# Patient Record
Sex: Female | Born: 1945 | Race: White | Hispanic: No | Marital: Single | Smoking: Current some day smoker
Health system: Southern US, Community
[De-identification: ages and names within clinical notes are randomized; demographics above are authoritative.]

## PROBLEM LIST (undated history)

## (undated) DIAGNOSIS — G629 Polyneuropathy, unspecified: Secondary | ICD-10-CM

## (undated) DIAGNOSIS — I4891 Unspecified atrial fibrillation: Secondary | ICD-10-CM

## (undated) DIAGNOSIS — H269 Unspecified cataract: Secondary | ICD-10-CM

## (undated) DIAGNOSIS — E079 Disorder of thyroid, unspecified: Secondary | ICD-10-CM

## (undated) DIAGNOSIS — E119 Type 2 diabetes mellitus without complications: Secondary | ICD-10-CM

## (undated) DIAGNOSIS — H3530 Unspecified macular degeneration: Secondary | ICD-10-CM

## (undated) HISTORY — PX: HERNIA REPAIR: SHX51

## (undated) HISTORY — PX: SPLENECTOMY: SUR1306

## (undated) HISTORY — PX: OTHER SURGICAL HISTORY: SHX169

## (undated) HISTORY — PX: ABDOMINAL HYSTERECTOMY: SHX81

## (undated) HISTORY — PX: CHOLECYSTECTOMY: SHX55

## (undated) HISTORY — PX: APPENDECTOMY: SHX54

---

## 2015-07-29 ENCOUNTER — Emergency Department
Admission: EM | Admit: 2015-07-29 | Discharge: 2015-07-29 | Disposition: A | Discharge status: Home / Self Care | Payer: Medicare PPO | Attending: Emergency Medicine | Admitting: Emergency Medicine

## 2015-07-29 ENCOUNTER — Encounter: Payer: Self-pay | Admitting: *Deleted

## 2015-07-29 DIAGNOSIS — F419 Anxiety disorder, unspecified: Secondary | ICD-10-CM | POA: Insufficient documentation

## 2015-07-29 DIAGNOSIS — Z760 Encounter for issue of repeat prescription: Secondary | ICD-10-CM | POA: Diagnosis present

## 2015-07-29 DIAGNOSIS — E119 Type 2 diabetes mellitus without complications: Secondary | ICD-10-CM | POA: Diagnosis not present

## 2015-07-29 DIAGNOSIS — Z720 Tobacco use: Secondary | ICD-10-CM | POA: Insufficient documentation

## 2015-07-29 DIAGNOSIS — G8929 Other chronic pain: Secondary | ICD-10-CM | POA: Diagnosis not present

## 2015-07-29 DIAGNOSIS — R109 Unspecified abdominal pain: Secondary | ICD-10-CM | POA: Insufficient documentation

## 2015-07-29 HISTORY — DX: Unspecified cataract: H26.9

## 2015-07-29 HISTORY — DX: Disorder of thyroid, unspecified: E07.9

## 2015-07-29 HISTORY — DX: Polyneuropathy, unspecified: G62.9

## 2015-07-29 HISTORY — DX: Unspecified macular degeneration: H35.30

## 2015-07-29 HISTORY — DX: Unspecified atrial fibrillation: I48.91

## 2015-07-29 HISTORY — DX: Type 2 diabetes mellitus without complications: E11.9

## 2015-07-29 MED ORDER — TRAZODONE HCL 100 MG PO TABS: 100.0000 mg | ORAL_TABLET | Freq: Every day | ORAL | Status: AC

## 2015-07-29 MED ORDER — OXYCODONE-ACETAMINOPHEN 5-325 MG PO TABS
1.0000 | ORAL_TABLET | Freq: Once | ORAL | Status: AC
  Administered 2015-07-29: 1 via ORAL
  Filled 2015-07-29: qty 1

## 2015-07-29 MED ORDER — OXYCODONE-ACETAMINOPHEN 7.5-325 MG PO TABS: 1.0000 | ORAL_TABLET | Freq: Three times a day (TID) | ORAL | Status: DC | PRN

## 2015-07-29 MED ORDER — LORAZEPAM 0.5 MG PO TABS
0.5000 mg | ORAL_TABLET | Freq: Once | ORAL | Status: AC
  Administered 2015-07-29: 0.5 mg via ORAL

## 2015-07-29 MED ORDER — LORAZEPAM 0.5 MG PO TABS: 0.5000 mg | ORAL_TABLET | Freq: Four times a day (QID) | ORAL | Status: AC | PRN

## 2015-07-29 NOTE — ED Notes (Signed)
Patient is here with med tech from [facility]. Med tech Autumn AndreasGladys [name] states patient is out of several medications until she can call MD on Monday. Medications include oxycodone 7.5mg /325mg  and Lorazepam 0.5mg . Patient was recently transferred from another facility. Patient ran out of meds after yesterday doses.

## 2015-07-29 NOTE — ED Notes (Signed)
Discussed discharge instructions, prescriptions, and follow-up care with patient and care giver. No questions or concerns at this time. Pt stable at discharge. 

## 2015-07-29 NOTE — ED Provider Notes (Signed)
[facility] Emergency Department Provider Note ____________________________________________  Time seen: Approximately 2:37 PM  I have reviewed the triage vital signs and the nursing notes.   HISTORY  Chief Complaint Medication Refill   HPI Autumn [name] is a 69 y.o. female who presents to the emergency department for medication refill. She was recently moved from one living facility to another and is out of some of her medications. Her last dose of lorazepam and percocet was last night. She states she has been on these medications since having a splenectomy and had multiple rib fractures on the left side. Caregiver at bedside will have her see MD on Monday, but requests medications until that appointment.No new complaint.  Past Medical History  Diagnosis Date  . Macular degeneration   . Neuropathy (HCC)   . Diabetes mellitus without complication (HCC)   . Cataracts, bilateral   . Thyroid disease   . Atrial fibrillation (HCC)     There are no active problems to display for this patient.   Past Surgical History  Procedure Laterality Date  . Abdominal hysterectomy      partial  . Appendectomy    . Cholecystectomy    . Splenectomy    . Liver repair      liver laceration  . Hernia repair      hiatal    Current Outpatient Rx  Name  Route  Sig  Dispense  Refill  . LORazepam (ATIVAN) 0.5 MG tablet   Oral   Take 1 tablet (0.5 mg total) by mouth every 6 (six) hours as needed for anxiety.   8 tablet   0   . oxyCODONE-acetaminophen (PERCOCET) 7.5-325 MG tablet   Oral   Take 1 tablet by mouth every 8 (eight) hours as needed for severe pain.   8 tablet   0   . traZODone (DESYREL) 100 MG tablet   Oral   Take 1 tablet (100 mg total) by mouth at bedtime.   2 tablet   0     Allergies Ceclor; Iodine; and Stelazine  No family history on file.  Social History Social History  Substance Use Topics  . Smoking status: Current Some Day Smoker   . Smokeless tobacco: None  . Alcohol Use: No    Review of Systems Constitutional: No fever/chills Eyes: No visual changes. ENT: No sore throat. Cardiovascular: Denies chest pain. Respiratory: Denies shortness of breath. Gastrointestinal: chronic abdominal/thoracic pain.  No nausea, no vomiting.  No diarrhea.  No constipation. Genitourinary: Negative for dysuria. Musculoskeletal: Negative for back pain. Skin: Negative for rash. Neurological: Negative for headaches, focal weakness or numbness.  10-point ROS otherwise negative.  ____________________________________________   PHYSICAL EXAM:  VITAL SIGNS: ED Triage Vitals  Enc Vitals Group     BP 07/29/15 1332 132/78 mmHg     Pulse Rate 07/29/15 1332 89     Resp 07/29/15 1332 20     Temp 07/29/15 1332 98.9 F (37.2 C)     Temp Source 07/29/15 1332 Oral     SpO2 07/29/15 1332 93 %     Weight 07/29/15 1332 195 lb (88.451 kg)     Height 07/29/15 1332 5\' 6"  (1.676 m)     Head Cir --      Peak Flow --      Pain Score 07/29/15 1337 9     Pain Loc --      Pain Edu? --      Excl. in GC? --  Constitutional: Alert and oriented. Well appearing and in no acute distress. Eyes: Conjunctivae are normal. PERRL. EOMI. Head: Atraumatic. Nose: No congestion/rhinnorhea. Mouth/Throat: Mucous membranes are moist.  Oropharynx non-erythematous. Neck: No stridor.   Cardiovascular: Normal rate, regular rhythm. Grossly normal heart sounds.  Good peripheral circulation. Respiratory: Normal respiratory effort.  No retractions. Lungs CTAB. Gastrointestinal: Soft and nontender. No distention. No abdominal bruits. No CVA tenderness. Surgical scar noted on abdomen. Musculoskeletal: No lower extremity tenderness nor edema.  No joint effusions. Neurologic:  Normal speech and language. No gross focal neurologic deficits are appreciated. No gait instability. Skin:  Skin is warm, dry and intact. No rash noted. Psychiatric: Mood and affect are  normal. Very anxious.  ____________________________________________   LABS (all labs ordered are listed, but only abnormal results are displayed)  Labs Reviewed - No data to display ____________________________________________  EKG   ____________________________________________  RADIOLOGY  Not indicted. ____________________________________________   PROCEDURES  Procedure(s) performed: None  Critical Care performed: No  ____________________________________________   INITIAL IMPRESSION / ASSESSMENT AND PLAN / ED COURSE  Pertinent labs & imaging results that were available during my care of the patient were reviewed by me and considered in my medical decision making (see chart for details).  Patient was given doses of lorazepam and percocet to prevent withdrawal. Caregiver was advised to have her see the MD assigned to the facility on Monday. She was advised to return to the ER for symptoms that change or worsen or for new concerns if unable to see the MD. ____________________________________________   FINAL CLINICAL IMPRESSION(S) / ED DIAGNOSES  Final diagnoses:  Encounter for medication refill      Chinita Pester, FNP 07/29/15 1442  Jennye Moccasin, MD 07/29/15 1536

## 2015-07-29 NOTE — Discharge Instructions (Signed)
Medicine Refill at the Emergency Department  We have refilled your medicine today, but it is best for you to get refills through your primary health care provider's office. In the future, please plan ahead so you do not need to get refills from the emergency department.  If the medicine we refilled was a maintenance medicine, you may have received only enough to get you by until you are able to see your regular health care provider.     This information is not intended to replace advice given to you by your health care provider. Make sure you discuss any questions you have with your health care provider.     Document Released: 12/27/2003 Document Revised: 09/30/2014 Document Reviewed: 12/17/2013  Elsevier Interactive Patient Education 2016 [employer]

## 2015-12-27 ENCOUNTER — Ambulatory Visit: Payer: Medicare PPO | Admitting: Anesthesiology

## 2016-03-29 ENCOUNTER — Other Ambulatory Visit: Payer: Self-pay | Admitting: Family Medicine

## 2016-03-29 DIAGNOSIS — Z720 Tobacco use: Secondary | ICD-10-CM

## 2016-09-25 ENCOUNTER — Ambulatory Visit
Admission: RE | Admit: 2016-09-25 | Discharge: 2016-09-25 | Disposition: A | Discharge status: Home / Self Care | Payer: Medicare PPO | Source: Ambulatory Visit | Attending: Primary Care | Admitting: Primary Care

## 2016-09-25 ENCOUNTER — Other Ambulatory Visit: Payer: Self-pay | Admitting: Primary Care

## 2016-09-25 DIAGNOSIS — R918 Other nonspecific abnormal finding of lung field: Secondary | ICD-10-CM | POA: Insufficient documentation

## 2016-09-25 DIAGNOSIS — R0602 Shortness of breath: Secondary | ICD-10-CM

## 2016-10-09 ENCOUNTER — Encounter: Payer: Self-pay | Admitting: Emergency Medicine

## 2016-10-09 ENCOUNTER — Emergency Department: Payer: Medicare Other

## 2016-10-09 ENCOUNTER — Inpatient Hospital Stay
Admission: EM | Admit: 2016-10-09 | Discharge: 2016-10-12 | DRG: 603 | Disposition: A | Discharge status: Home / Self Care | Payer: Medicare Other | Attending: Internal Medicine | Admitting: Internal Medicine

## 2016-10-09 DIAGNOSIS — Z91041 Radiographic dye allergy status: Secondary | ICD-10-CM | POA: Diagnosis not present

## 2016-10-09 DIAGNOSIS — L03116 Cellulitis of left lower limb: Secondary | ICD-10-CM

## 2016-10-09 DIAGNOSIS — Z8249 Family history of ischemic heart disease and other diseases of the circulatory system: Secondary | ICD-10-CM | POA: Diagnosis not present

## 2016-10-09 DIAGNOSIS — Z881 Allergy status to other antibiotic agents status: Secondary | ICD-10-CM

## 2016-10-09 DIAGNOSIS — Z79899 Other long term (current) drug therapy: Secondary | ICD-10-CM

## 2016-10-09 DIAGNOSIS — Z888 Allergy status to other drugs, medicaments and biological substances status: Secondary | ICD-10-CM | POA: Diagnosis not present

## 2016-10-09 DIAGNOSIS — K219 Gastro-esophageal reflux disease without esophagitis: Secondary | ICD-10-CM | POA: Diagnosis present

## 2016-10-09 DIAGNOSIS — E039 Hypothyroidism, unspecified: Secondary | ICD-10-CM | POA: Diagnosis not present

## 2016-10-09 DIAGNOSIS — M7989 Other specified soft tissue disorders: Secondary | ICD-10-CM | POA: Diagnosis present

## 2016-10-09 DIAGNOSIS — H3530 Unspecified macular degeneration: Secondary | ICD-10-CM | POA: Diagnosis not present

## 2016-10-09 DIAGNOSIS — I10 Essential (primary) hypertension: Secondary | ICD-10-CM | POA: Diagnosis not present

## 2016-10-09 DIAGNOSIS — F17210 Nicotine dependence, cigarettes, uncomplicated: Secondary | ICD-10-CM | POA: Diagnosis present

## 2016-10-09 DIAGNOSIS — I4891 Unspecified atrial fibrillation: Secondary | ICD-10-CM | POA: Diagnosis present

## 2016-10-09 DIAGNOSIS — Z9081 Acquired absence of spleen: Secondary | ICD-10-CM | POA: Diagnosis not present

## 2016-10-09 DIAGNOSIS — Z7901 Long term (current) use of anticoagulants: Secondary | ICD-10-CM | POA: Diagnosis not present

## 2016-10-09 DIAGNOSIS — F329 Major depressive disorder, single episode, unspecified: Secondary | ICD-10-CM | POA: Diagnosis present

## 2016-10-09 DIAGNOSIS — H269 Unspecified cataract: Secondary | ICD-10-CM | POA: Diagnosis not present

## 2016-10-09 DIAGNOSIS — Z90710 Acquired absence of both cervix and uterus: Secondary | ICD-10-CM

## 2016-10-09 DIAGNOSIS — Z79891 Long term (current) use of opiate analgesic: Secondary | ICD-10-CM

## 2016-10-09 DIAGNOSIS — G8929 Other chronic pain: Secondary | ICD-10-CM | POA: Diagnosis not present

## 2016-10-09 DIAGNOSIS — E1140 Type 2 diabetes mellitus with diabetic neuropathy, unspecified: Secondary | ICD-10-CM | POA: Diagnosis present

## 2016-10-09 DIAGNOSIS — L0390 Cellulitis, unspecified: Secondary | ICD-10-CM | POA: Diagnosis present

## 2016-10-09 LAB — CBC WITH DIFFERENTIAL/PLATELET
BASOS PCT: 1 %
Basophils Absolute: 0.2 10*3/uL — ABNORMAL HIGH (ref 0–0.1)
EOS ABS: 0.3 10*3/uL (ref 0–0.7)
Eosinophils Relative: 2 %
HCT: 40.7 % (ref 35.0–47.0)
Hemoglobin: 13.6 g/dL (ref 12.0–16.0)
LYMPHS ABS: 1.6 10*3/uL (ref 1.0–3.6)
Lymphocytes Relative: 12 %
MCH: 32.6 pg (ref 26.0–34.0)
MCHC: 33.3 g/dL (ref 32.0–36.0)
MCV: 97.8 fL (ref 80.0–100.0)
Monocytes Absolute: 1.3 10*3/uL — ABNORMAL HIGH (ref 0.2–0.9)
Monocytes Relative: 10 %
Neutro Abs: 9.9 10*3/uL — ABNORMAL HIGH (ref 1.4–6.5)
Neutrophils Relative %: 75 %
PLATELETS: 195 10*3/uL (ref 150–440)
RBC: 4.16 MIL/uL (ref 3.80–5.20)
RDW: 14 % (ref 11.5–14.5)
WBC: 13.2 10*3/uL — AB (ref 3.6–11.0)

## 2016-10-09 LAB — BASIC METABOLIC PANEL
Anion gap: 6 (ref 5–15)
BUN: 11 mg/dL (ref 6–20)
CALCIUM: 9 mg/dL (ref 8.9–10.3)
CHLORIDE: 104 mmol/L (ref 101–111)
CO2: 30 mmol/L (ref 22–32)
CREATININE: 1.06 mg/dL — AB (ref 0.44–1.00)
GFR calc Af Amer: >60 mL/min (ref >60)
GFR calc non Af Amer: 52 mL/min — ABNORMAL LOW (ref >60)
Glucose, Bld: 147 mg/dL — ABNORMAL HIGH (ref 65–99)
Potassium: 4.1 mmol/L (ref 3.5–5.1)
SODIUM: 140 mmol/L (ref 135–145)

## 2016-10-09 LAB — MRSA PCR SCREENING: MRSA BY PCR: POSITIVE — AB

## 2016-10-09 LAB — PROTIME-INR
INR: 3.08
PROTHROMBIN TIME: 32.5 s — AB (ref 11.4–15.2)

## 2016-10-09 MED ORDER — OXYCODONE HCL ER 10 MG PO T12A
10.0000 mg | EXTENDED_RELEASE_TABLET | Freq: Four times a day (QID) | ORAL | Status: DC
Start: 2016-10-09 — End: 2016-10-12
  Administered 2016-10-09 – 2016-10-12 (×12): 10 mg via ORAL
  Filled 2016-10-09 (×12): qty 1

## 2016-10-09 MED ORDER — GABAPENTIN 300 MG PO CAPS
300.0000 mg | ORAL_CAPSULE | Freq: Every day | ORAL | Status: DC
  Administered 2016-10-09 – 2016-10-11 (×3): 300 mg via ORAL
  Filled 2016-10-09 (×3): qty 1

## 2016-10-09 MED ORDER — CHOLECALCIFEROL 10 MCG (400 UNIT) PO TABS
400.0000 [IU] | ORAL_TABLET | Freq: Every day | ORAL | Status: DC
  Administered 2016-10-10 – 2016-10-12 (×3): 400 [IU] via ORAL
  Filled 2016-10-09 (×3): qty 1

## 2016-10-09 MED ORDER — ACETAMINOPHEN 325 MG PO TABS: 650.0000 mg | ORAL_TABLET | Freq: Four times a day (QID) | ORAL | Status: DC | PRN

## 2016-10-09 MED ORDER — LEVOTHYROXINE SODIUM 125 MCG PO TABS
125.0000 ug | ORAL_TABLET | Freq: Every day | ORAL | Status: DC
  Administered 2016-10-10 – 2016-10-12 (×3): 125 ug via ORAL
  Filled 2016-10-09 (×3): qty 1

## 2016-10-09 MED ORDER — FENTANYL 12 MCG/HR TD PT72
12.5000 ug | MEDICATED_PATCH | TRANSDERMAL | Status: DC
  Administered 2016-10-11: 12.5 ug via TRANSDERMAL
  Filled 2016-10-09: qty 1

## 2016-10-09 MED ORDER — VANCOMYCIN HCL IN DEXTROSE 1-5 GM/200ML-% IV SOLN
1000.0000 mg | INTRAVENOUS | Status: DC
  Administered 2016-10-10 – 2016-10-12 (×4): 1000 mg via INTRAVENOUS
  Filled 2016-10-09 (×5): qty 200

## 2016-10-09 MED ORDER — MIRTAZAPINE 15 MG PO TBDP
30.0000 mg | ORAL_TABLET | Freq: Every day | ORAL | Status: DC
  Administered 2016-10-09 – 2016-10-11 (×3): 30 mg via ORAL
  Filled 2016-10-09 (×3): qty 2

## 2016-10-09 MED ORDER — ONDANSETRON HCL 4 MG/2ML IJ SOLN: 4.0000 mg | Freq: Four times a day (QID) | INTRAMUSCULAR | Status: DC | PRN

## 2016-10-09 MED ORDER — ENOXAPARIN SODIUM 40 MG/0.4ML ~~LOC~~ SOLN: 40.0000 mg | SUBCUTANEOUS | Status: DC

## 2016-10-09 MED ORDER — FAMOTIDINE 20 MG PO TABS
20.0000 mg | ORAL_TABLET | Freq: Every day | ORAL | Status: DC
Start: 2016-10-10 — End: 2016-10-12
  Administered 2016-10-10 – 2016-10-12 (×3): 20 mg via ORAL
  Filled 2016-10-09 (×3): qty 1

## 2016-10-09 MED ORDER — CLINDAMYCIN HCL 150 MG PO CAPS
300.0000 mg | ORAL_CAPSULE | Freq: Once | ORAL | Status: AC
  Administered 2016-10-09: 300 mg via ORAL
  Filled 2016-10-09: qty 2

## 2016-10-09 MED ORDER — DULOXETINE HCL 60 MG PO CPEP
60.0000 mg | ORAL_CAPSULE | Freq: Every day | ORAL | Status: DC
  Administered 2016-10-10 – 2016-10-12 (×3): 60 mg via ORAL
  Filled 2016-10-09 (×3): qty 1

## 2016-10-09 MED ORDER — VANCOMYCIN HCL IN DEXTROSE 1-5 GM/200ML-% IV SOLN
1000.0000 mg | Freq: Once | INTRAVENOUS | Status: AC
  Administered 2016-10-09: 1000 mg via INTRAVENOUS
  Filled 2016-10-09: qty 200

## 2016-10-09 MED ORDER — TRAZODONE HCL 100 MG PO TABS
100.0000 mg | ORAL_TABLET | Freq: Every day | ORAL | Status: DC
  Administered 2016-10-09 – 2016-10-11 (×3): 100 mg via ORAL
  Filled 2016-10-09 (×3): qty 1

## 2016-10-09 MED ORDER — METOPROLOL TARTRATE 50 MG PO TABS
100.0000 mg | ORAL_TABLET | Freq: Two times a day (BID) | ORAL | Status: DC
  Administered 2016-10-09 – 2016-10-12 (×6): 100 mg via ORAL
  Filled 2016-10-09 (×6): qty 2

## 2016-10-09 MED ORDER — ONDANSETRON HCL 4 MG PO TABS: 4.0000 mg | ORAL_TABLET | Freq: Four times a day (QID) | ORAL | Status: DC | PRN

## 2016-10-09 MED ORDER — ACETAMINOPHEN 650 MG RE SUPP: 650.0000 mg | Freq: Four times a day (QID) | RECTAL | Status: DC | PRN

## 2016-10-09 MED ORDER — WARFARIN - PHYSICIAN DOSING INPATIENT: Freq: Every day | Status: DC

## 2016-10-09 MED ORDER — WARFARIN SODIUM 3 MG PO TABS
6.0000 mg | ORAL_TABLET | Freq: Every day | ORAL | Status: DC
  Administered 2016-10-09: 6 mg via ORAL
  Filled 2016-10-09: qty 2

## 2016-10-09 MED ORDER — AMLODIPINE BESYLATE 10 MG PO TABS
10.0000 mg | ORAL_TABLET | Freq: Every day | ORAL | Status: DC
  Administered 2016-10-10 – 2016-10-12 (×2): 10 mg via ORAL
  Filled 2016-10-09 (×2): qty 1

## 2016-10-09 NOTE — ED Notes (Signed)
Marchelle Folksmanda Sales promotion account executive(Director of group home): [phone] [facility] (Group Home): [phone]

## 2016-10-09 NOTE — H&P (Signed)
[employer] [facility] [place] [facility]   PATIENT NAME: Autumn [name]    MR#:  [id number]  DATE OF BIRTH:  [birth year]  DATE OF ADMISSION:  10/09/2016  PRIMARY CARE PHYSICIAN: Ruel Favors, MD   REQUESTING/REFERRING PHYSICIAN: Dr. Governor Rooks  CHIEF COMPLAINT:   Chief Complaint  Patient presents with  . Wound Infection    HISTORY OF PRESENT ILLNESS:  Autumn [name]  is a 71 y.o. female with a known history of atrial fibrillation, diabetes, macular degeneration, neuropathy, hypothyroidism who presents to the hospital due to right lower extremity redness swelling. Patient resides at a assisted living and the nursing staff noticed that her left leg was red and swollen and therefore sent her to the ER for further evaluation. Patient was clinically noted to have a right lower extremity cellulitis and therefore hospitalist services were contacted further treatment and evaluation. Patient denies any fever, chills, sea, vomiting, abdominal pain or any other associated symptoms presently.  PAST MEDICAL HISTORY:   Past Medical History:  Diagnosis Date  . Atrial fibrillation (HCC)   . Cataracts, bilateral   . Diabetes mellitus without complication (HCC)   . Macular degeneration   . Neuropathy (HCC)   . Thyroid disease     PAST SURGICAL HISTORY:   Past Surgical History:  Procedure Laterality Date  . ABDOMINAL HYSTERECTOMY     partial  . APPENDECTOMY    . CHOLECYSTECTOMY    . HERNIA REPAIR     hiatal  . liver repair     liver laceration  . SPLENECTOMY      SOCIAL HISTORY:   Social History  Substance Use Topics  . Smoking status: Current Some Day Smoker    Packs/day: 0.50    Years: 50.00    Types: Cigarettes  . Smokeless tobacco: Never Used  . Alcohol use No    FAMILY HISTORY:   Family History  Problem Relation Age of Onset  . Heart failure Mother   . Throat cancer Father     DRUG ALLERGIES:   Allergies  Allergen Reactions  . Ceclor [Cefaclor]  Hives  . Iodine Hives    IV contrast dye  . Stelazine [Trifluoperazine] Hives    REVIEW OF SYSTEMS:   Review of Systems  Constitutional: Negative for chills, fever and weight loss.  HENT: Negative for congestion, nosebleeds and tinnitus.   Eyes: Negative for blurred vision, double vision and redness.  Respiratory: Negative for cough, hemoptysis, shortness of breath and wheezing.   Cardiovascular: Positive for leg swelling (right Lower ext. ). Negative for chest pain, orthopnea and PND.  Gastrointestinal: Negative for abdominal pain, diarrhea, melena, nausea and vomiting.  Genitourinary: Negative for dysuria, hematuria and urgency.  Musculoskeletal: Negative for falls and joint pain.  Neurological: Negative for dizziness, tingling, sensory change, focal weakness, seizures, weakness and headaches.  Endo/Heme/Allergies: Negative for polydipsia. Does not bruise/bleed easily.  Psychiatric/Behavioral: Negative for depression and memory loss. The patient is not nervous/anxious.   All other systems reviewed and are negative.   MEDICATIONS AT HOME:   Prior to Admission medications   Medication Sig Start Date End Date Taking? Authorizing Provider  amLODipine (NORVASC) 10 MG tablet Take 10 mg by mouth daily.   Yes Historical Provider, MD  Cholecalciferol (VITAMIN D3) 400 units CHEW Chew 1 tablet by mouth daily.   Yes Historical Provider, MD  DULoxetine (CYMBALTA) 60 MG capsule Take 60 mg by mouth daily.   Yes Historical Provider, MD  fentaNYL (DURAGESIC - DOSED MCG/HR)  12 MCG/HR Place 1 patch onto the skin every 3 (three) days. 11/02/15  Yes Historical Provider, MD  gabapentin (NEURONTIN) 300 MG capsule Take 300 mg by mouth at bedtime.   Yes Historical Provider, MD  levothyroxine (SYNTHROID, LEVOTHROID) 125 MCG tablet Take 125 mcg by mouth daily.    Yes Historical Provider, MD  metoprolol (LOPRESSOR) 100 MG tablet Take 1 tablet by mouth 2 (two) times daily.   Yes Historical Provider, MD   mirtazapine (REMERON SOL-TAB) 30 MG disintegrating tablet Take 30 mg by mouth at bedtime.   Yes Historical Provider, MD  oxyCODONE (OXYCONTIN) 10 mg 12 hr tablet Take 10 mg by mouth 4 (four) times daily.   Yes Historical Provider, MD  ranitidine (ZANTAC) 150 MG tablet Take 150 mg by mouth 2 (two) times daily as needed.   Yes Historical Provider, MD  traZODone (DESYREL) 100 MG tablet Take 1 tablet (100 mg total) by mouth at bedtime. 07/29/15  Yes Cari B Triplett, FNP  warfarin (COUMADIN) 6 MG tablet Take 6 mg by mouth at bedtime. 09/24/16 09/24/17 Yes Historical Provider, MD  oxyCODONE-acetaminophen (PERCOCET) 7.5-325 MG tablet Take 1 tablet by mouth every 8 (eight) hours as needed for severe pain. Patient not taking: Reported on 10/09/2016 07/29/15   Chinita Pester, FNP      VITAL SIGNS:  Blood pressure (!) 122/55, pulse 89, temperature 99.2 F (37.3 C), temperature source Oral, resp. rate 14, SpO2 94 %.  PHYSICAL EXAMINATION:  Physical Exam  GENERAL:  71 y.o.-year-old patient lying in the bed in no acute distress.  EYES: Pupils equal, round, reactive to light and accommodation. No scleral icterus. Extraocular muscles intact.  HEENT: Head atraumatic, normocephalic. Oropharynx and nasopharynx clear. No oropharyngeal erythema, moist oral mucosa  NECK:  Supple, no jugular venous distention. No thyroid enlargement, no tenderness.  LUNGS: Normal breath sounds bilaterally, no wheezing, rales, rhonchi. No use of accessory muscles of respiration.  CARDIOVASCULAR: S1, S2 RRR. No murmurs, rubs, gallops, clicks.  ABDOMEN: Soft, nontender, nondistended. Bowel sounds present. No organomegaly or mass.  EXTREMITIES: RLE edema > Left, No cyanosis, or clubbing. + 2 pedal & radial pulses b/l.   NEUROLOGIC: Cranial nerves II through XII are intact. No focal Motor or sensory deficits appreciated b/l. PSYCHIATRIC: The patient is alert and oriented x 3. SKIN: No obvious rash, lesion, or ulcer.  LLE consistent with  cellulitis.   LABORATORY PANEL:   CBC  Recent Labs Lab 10/09/16 1040  WBC 13.2*  HGB 13.6  HCT 40.7  PLT 195   ------------------------------------------------------------------------------------------------------------------  Chemistries   Recent Labs Lab 10/09/16 1040  NA 140  K 4.1  CL 104  CO2 30  GLUCOSE 147*  BUN 11  CREATININE 1.06*  CALCIUM 9.0   ------------------------------------------------------------------------------------------------------------------  Cardiac Enzymes No results for input(s): TROPONINI in the last 168 hours. ------------------------------------------------------------------------------------------------------------------  RADIOLOGY:  US Venous Img Lower Unilateral Left  Result Date: 10/09/2016 CLINICAL DATA:  Left leg swelling for 10 days EXAM: LEFT LOWER EXTREMITY VENOUS DUPLEX ULTRASOUND TECHNIQUE: Doppler venous assessment of the left lower extremity deep venous system was performed, including characterization of spectral flow, compressibility, and phasicity. COMPARISON:  None. FINDINGS: There is complete compressibility of the common femoral, femoral, and popliteal veins. Doppler analysis demonstrates respiratory phasicity and augmentation of flow with calf compression. No obvious superficial vein or calf vein thrombosis. 0.8 cm short axis diameter left inguinal lymph node is noted. This is not pathologically enlarged by measurement criteria. IMPRESSION: No evidence of left lower extremity  DVT. Electronically Signed   By: Jolaine ClickArthur  Hoss M.D.   On: 10/09/2016 12:02     IMPRESSION AND PLAN:   71 year old female with past medical history of atrial fibrillation, macular degeneration, diabetes, hypothyroidism, neuropathy who presented to the hospital with Left lower extremity redness and swelling.  1. Left lower extremity cellulitis-this is a cause of patient's redness and swelling. -Dopplers of the lower extremities are (-) for DVT.  -  place on IV VAncomycin and follow cultures.   2. Leukocytosis - due to # 1 - will treat w/ IV abx and follow WBC count.   3. Chronic Pain - cont. Oxycodone, Fentanyl patch.   4. HTN - cont. Metoprolol, Norvasc  5. Atrial fibrillation-rate controlled. Continue metoprolol. -Continue Coumadin. INR therapeutic.  6. Hypothyroidism-continue Synthroid.  7. Neuropathy-continue gabapentin.  8. Depression-continue Cymbalta.  9. GERD-continue Zantac.  All the records are reviewed and case discussed with ED provider. Management plans discussed with the patient, family and they are in agreement.  CODE STATUS: Full  TOTAL TIME TAKING CARE OF THIS PATIENT: 45 minutes.    Houston SirenSAINANI,VIVEK J M.D on 10/09/2016 at 2:35 PM  Between 7am to 6pm - Pager - [phone]  After 6pm go to [email or web address] - password [email or web address] [facility]  [facility]  Office  [phone]  CC: Primary care physician; Ruel FavorsKrichna F Sowles, MD

## 2016-10-09 NOTE — ED Triage Notes (Addendum)
Pt to ED via EMS from group home, c/o left leg infection, red and warm to touch. Per EMS VS stable. Pt states she has noticed swelling x1wk. Obvious redness and swelling noted to area.

## 2016-10-09 NOTE — ED Notes (Signed)
Dr. Lord in room to assess patient.  Will continue to monitor.   

## 2016-10-09 NOTE — Progress Notes (Signed)
Pharmacy Antibiotic Note  Autumn [name] is a 71 y.o. female admitted on 10/09/2016 with  cellulitis.  Pharmacy has been consulted for Vancomycin dosing.  Plan: Ke: 0.051   T1/2:  14  Vd: 62  Will start the patient on Vancomycin 1gm IV every 18 hours after 6 hours stack dosing. Calculated trough at Css is 13. Will plan for trough prior to 4th dose. Will continue to monitor renal function and adjust dose as needed.   Height: 5\' 6"  (167.6 cm) Weight: 195 lb (88.5 kg) IBW/kg (Calculated) : 59.3  Temp (24hrs), Avg:99.2 F (37.3 C), Min:99.2 F (37.3 C), Max:99.2 F (37.3 C)   Recent Labs Lab 10/09/16 1040  WBC 13.2*  CREATININE 1.06*    Estimated Creatinine Clearance: 56.1 mL/min (by C-G formula based on SCr of 1.06 mg/dL (H)).    Allergies  Allergen Reactions  . Ceclor [Cefaclor] Hives  . Iodine Hives    IV contrast dye  . Stelazine [Trifluoperazine] Hives    Antimicrobials this admission: 1/17 vancomycin >>   Dose adjustments this admission:   Microbiology results:  Thank you for allowing pharmacy to be a part of this patient's care.  Gardner CandleSheema M Chellie Vanlue, PharmD, BCPS Clinical Pharmacist 10/09/2016 5:10 PM

## 2016-10-09 NOTE — ED Provider Notes (Signed)
[facility] Emergency Department Provider Note ____________________________________________   I have reviewed the triage vital signs and the triage nursing note.  HISTORY  Chief Complaint Wound Infection   Historian Patient  HPI Autumn [name] is a 71 y.o. female from a group home, history of afib, dm, and le edema, states she just got over uri/bronchitis here for eval of lle swelling and redness.  States was normal about 1 week ago, slowly getting worse.  Mild pain, moderate to severe swelling and redness.  No associated fever.  The uri symptoms are all improved/essentially resolved    Past Medical History:  Diagnosis Date  . Atrial fibrillation (HCC)   . Cataracts, bilateral   . Diabetes mellitus without complication (HCC)   . Macular degeneration   . Neuropathy (HCC)   . Thyroid disease     There are no active problems to display for this patient.   Past Surgical History:  Procedure Laterality Date  . ABDOMINAL HYSTERECTOMY     partial  . APPENDECTOMY    . CHOLECYSTECTOMY    . HERNIA REPAIR     hiatal  . liver repair     liver laceration  . SPLENECTOMY      Prior to Admission medications   Medication Sig Start Date End Date Taking? Authorizing Provider  amLODipine (NORVASC) 10 MG tablet Take 10 mg by mouth daily.   Yes Historical Provider, MD  Cholecalciferol (VITAMIN D3) 400 units CHEW Chew 1 tablet by mouth daily.   Yes Historical Provider, MD  DULoxetine (CYMBALTA) 60 MG capsule Take 60 mg by mouth daily.   Yes Historical Provider, MD  fentaNYL (DURAGESIC - DOSED MCG/HR) 12 MCG/HR Place 1 patch onto the skin every 3 (three) days. 11/02/15  Yes Historical Provider, MD  gabapentin (NEURONTIN) 300 MG capsule Take 300 mg by mouth at bedtime.   Yes Historical Provider, MD  levothyroxine (SYNTHROID, LEVOTHROID) 137 MCG tablet Take 137 mcg by mouth daily.   Yes Historical Provider, MD  METOPROLOL TARTRATE PO Take 1 tablet by mouth 2 (two)  times daily.   Yes Historical Provider, MD  oxyCODONE (OXYCONTIN) 10 mg 12 hr tablet Take 10 mg by mouth 4 (four) times daily.   Yes Historical Provider, MD  ranitidine (ZANTAC) 150 MG tablet Take 150 mg by mouth 2 (two) times daily as needed.   Yes Historical Provider, MD  traZODone (DESYREL) 100 MG tablet Take 1 tablet (100 mg total) by mouth at bedtime. 07/29/15  Yes Cari B Triplett, FNP  warfarin (COUMADIN) 6 MG tablet Take 6 mg by mouth at bedtime. 09/24/16 09/24/17 Yes Historical Provider, MD  oxyCODONE-acetaminophen (PERCOCET) 7.5-325 MG tablet Take 1 tablet by mouth every 8 (eight) hours as needed for severe pain. Patient not taking: Reported on 10/09/2016 07/29/15   Chinita Pesterari B Triplett, FNP    Allergies  Allergen Reactions  . Ceclor [Cefaclor] Hives  . Iodine Hives    IV contrast dye  . Stelazine [Trifluoperazine] Hives    No family history on file.  Social History Social History  Substance Use Topics  . Smoking status: Current Some Day Smoker  . Smokeless tobacco: Never Used  . Alcohol use No    Review of Systems  Constitutional: Negative for fever. Eyes: Negative for visual changes. ENT: Negative for sore throat. Cardiovascular: Negative for chest pain. Respiratory: Negative for shortness of breath. Gastrointestinal: Negative for abdominal pain, vomiting and diarrhea. Genitourinary: Negative for dysuria. Musculoskeletal: Negative for back pain.  Positive for  left leg pain Skin: Negative for rash. Neurological: Negative for headache. 10 point Review of Systems otherwise negative ____________________________________________   PHYSICAL EXAM:  VITAL SIGNS: ED Triage Vitals [10/09/16 1034]  Enc Vitals Group     BP 130/76     Pulse Rate 95     Resp 16     Temp 99.2 F (37.3 C)     Temp Source Oral     SpO2 94 %     Weight      Height      Head Circumference      Peak Flow      Pain Score 0     Pain Loc      Pain Edu?      Excl. in GC?      Constitutional:  Alert and oriented. Well appearing and in no distress. HEENT   Head: Normocephalic and atraumatic.      Eyes: Conjunctivae are normal. PERRL. Normal extraocular movements.      Ears:         Nose: No congestion/rhinnorhea.   Mouth/Throat: Mucous membranes are moist.   Neck: No stridor. Cardiovascular/Chest: Normal rate, regular rhythm.  No murmurs, rubs, or gallops. Respiratory: Normal respiratory effort without tachypnea nor retractions. Breath sounds are clear and equal bilaterally. No wheezes/rales/rhonchi. Gastrointestinal: Soft. No distention, no guarding, no rebound. Nontender.    Genitourinary/rectal:Deferred Musculoskeletal: Patient in the left calf where is severe edema. Neurologic:  Normal speech and language. No gross or focal neurologic deficits are appreciated. Skin: Cellulitis appearance to lle Psychiatric: Mood and affect are normal. Speech and behavior are normal. Patient exhibits appropriate insight and judgment.    ____________________________________________  LABS (pertinent positives/negatives)  Labs Reviewed  BASIC METABOLIC PANEL - Abnormal; Notable for the following:       Result Value   Glucose, Bld 147 (*)    Creatinine, Ser 1.06 (*)    GFR calc non Af Amer 52 (*)    All other components within normal limits  CBC WITH DIFFERENTIAL/PLATELET - Abnormal; Notable for the following:    WBC 13.2 (*)    Neutro Abs 9.9 (*)    Monocytes Absolute 1.3 (*)    Basophils Absolute 0.2 (*)    All other components within normal limits  PROTIME-INR - Abnormal; Notable for the following:    Prothrombin Time 32.5 (*)    All other components within normal limits    ____________________________________________    EKG I, Governor Rooks, MD, the attending physician have personally viewed and interpreted all ECGs.  88 bpm. Atrial fibrillation. Normal axis. Normal ST and T-wave ____________________________________________  RADIOLOGY All Xrays were viewed by  me. Imaging interpreted by Radiologist.  Left lower extremity ultrasound:  IMPRESSION: No evidence of left lower extremity DVT. __________________________________________  PROCEDURES  Procedure(s) performed: None  Critical Care performed: None  ____________________________________________   ED COURSE / ASSESSMENT AND PLAN  Pertinent labs & imaging results that were available during my care of the patient were reviewed by me and considered in my medical decision making (see chart for details).    Ms. Greulich is here with very significantly swollen and red left lower extremity, and was ruled out for DVT, but consistent with cellulitis. Given the extent of the cellulitis, I think this is potentially limb threatening, and needs hospital observation/management.  She does have an elevated white blood cell count, however she is not febrile and does not have systemic symptoms at this point in time. She was treated with  a dose of clindamycin.    CONSULTATIONS:  Hospitalist for admission.   Patient / Family / Caregiver informed of clinical course, medical decision-making process, and agree with plan.    ___________________________________________   FINAL CLINICAL IMPRESSION(S) / ED DIAGNOSES   Final diagnoses:  Cellulitis of left lower extremity              Note: This dictation was prepared with Dragon dictation. Any transcriptional errors that result from this process are unintentional    Governor Rooks, MD 10/09/16 1311

## 2016-10-09 NOTE — ED Notes (Signed)
Patient gave verbal permission to discuss test results with Marchelle Folksmanda from her group home.

## 2016-10-10 DIAGNOSIS — L03116 Cellulitis of left lower limb: Secondary | ICD-10-CM | POA: Diagnosis not present

## 2016-10-10 DIAGNOSIS — M7989 Other specified soft tissue disorders: Secondary | ICD-10-CM | POA: Diagnosis not present

## 2016-10-10 LAB — BASIC METABOLIC PANEL
Anion gap: 6 (ref 5–15)
BUN: 10 mg/dL (ref 6–20)
CALCIUM: 8.3 mg/dL — AB (ref 8.9–10.3)
CO2: 29 mmol/L (ref 22–32)
CREATININE: 0.99 mg/dL (ref 0.44–1.00)
Chloride: 106 mmol/L (ref 101–111)
GFR calc non Af Amer: 57 mL/min — ABNORMAL LOW (ref >60)
Glucose, Bld: 123 mg/dL — ABNORMAL HIGH (ref 65–99)
Potassium: 3.7 mmol/L (ref 3.5–5.1)
SODIUM: 141 mmol/L (ref 135–145)

## 2016-10-10 LAB — CBC
HCT: 39.2 % (ref 35.0–47.0)
Hemoglobin: 13.2 g/dL (ref 12.0–16.0)
MCH: 32.9 pg (ref 26.0–34.0)
MCHC: 33.7 g/dL (ref 32.0–36.0)
MCV: 97.8 fL (ref 80.0–100.0)
PLATELETS: 167 10*3/uL (ref 150–440)
RBC: 4.01 MIL/uL (ref 3.80–5.20)
RDW: 14.4 % (ref 11.5–14.5)
WBC: 13.7 10*3/uL — AB (ref 3.6–11.0)

## 2016-10-10 LAB — PROTIME-INR
INR: 3.36
PROTHROMBIN TIME: 34.8 s — AB (ref 11.4–15.2)

## 2016-10-10 NOTE — Progress Notes (Signed)
[facility] [place] [facility]   PATIENT NAME: Autumn [name]    MR#:  [id number]  DATE OF BIRTH:  01-Dec-1945  SUBJECTIVE:   Cellulitis of lower extremity is improving  REVIEW OF SYSTEMS:    Review of Systems  Constitutional: Negative.  Negative for chills, fever and malaise/fatigue.  HENT: Negative.  Negative for ear discharge, ear pain, hearing loss, nosebleeds and sore throat.   Eyes: Negative.  Negative for blurred vision and pain.  Respiratory: Negative.  Negative for cough, hemoptysis, shortness of breath and wheezing.   Cardiovascular: Negative.  Negative for chest pain, palpitations and leg swelling.  Gastrointestinal: Negative.  Negative for abdominal pain, blood in stool, diarrhea, nausea and vomiting.  Genitourinary: Negative.  Negative for dysuria.  Musculoskeletal: Negative.  Negative for back pain.  Skin: Negative.        Cellulitis left lower extremity  Neurological: Negative for dizziness, tremors, speech change, focal weakness, seizures and headaches.  Endo/Heme/Allergies: Negative.  Does not bruise/bleed easily.  Psychiatric/Behavioral: Negative.  Negative for depression, hallucinations and suicidal ideas.    Tolerating Diet: yes      DRUG ALLERGIES:   Allergies  Allergen Reactions  . Ceclor [Cefaclor] Hives  . Iodine Hives    IV contrast dye  . Stelazine [Trifluoperazine] Hives    VITALS:  Blood pressure (!) 114/55, pulse 88, temperature 98.4 F (36.9 C), temperature source Oral, resp. rate 18, height 5\' 6"  (1.676 m), weight 88.5 kg (195 lb), SpO2 90 %.  PHYSICAL EXAMINATION:   Physical Exam  Constitutional: She is oriented to person, place, and time and well-developed, well-nourished, and in no distress. No distress.  HENT:  Head: Normocephalic.  Eyes: No scleral icterus.  Neck: Normal range of motion. Neck supple. No JVD present. No tracheal deviation present.  Cardiovascular: Normal rate, regular rhythm and normal  heart sounds.  Exam reveals no gallop and no friction rub.   No murmur heard. Pulmonary/Chest: Effort normal and breath sounds normal. No respiratory distress. She has no wheezes. She has no rales. She exhibits no tenderness.  Abdominal: Soft. Bowel sounds are normal. She exhibits no distension and no mass. There is no tenderness. There is no rebound and no guarding.  Musculoskeletal: Normal range of motion. She exhibits no edema.  Neurological: She is alert and oriented to person, place, and time.  Skin: Skin is warm. No rash noted. No erythema.  Marked erythema and swelling left lower extremity with some mild pain to palpation  Psychiatric: Affect and judgment normal.      LABORATORY PANEL:   CBC  Recent Labs Lab 10/10/16 0350  WBC 13.7*  HGB 13.2  HCT 39.2  PLT 167   ------------------------------------------------------------------------------------------------------------------  Chemistries   Recent Labs Lab 10/10/16 0350  NA 141  K 3.7  CL 106  CO2 29  GLUCOSE 123*  BUN 10  CREATININE 0.99  CALCIUM 8.3*   ------------------------------------------------------------------------------------------------------------------  Cardiac Enzymes No results for input(s): TROPONINI in the last 168 hours. ------------------------------------------------------------------------------------------------------------------  RADIOLOGY:  [place] Venous Img Lower Unilateral Left  Result Date: 10/09/2016 CLINICAL DATA:  Left leg swelling for 10 days EXAM: LEFT LOWER EXTREMITY VENOUS DUPLEX ULTRASOUND TECHNIQUE: Doppler venous assessment of the left lower extremity deep venous system was performed, including characterization of spectral flow, compressibility, and phasicity. COMPARISON:  None. FINDINGS: There is complete compressibility of the common femoral, femoral, and popliteal veins. Doppler analysis demonstrates respiratory phasicity and augmentation of flow with calf compression. No  obvious superficial vein or  calf vein thrombosis. 0.8 cm short axis diameter left inguinal lymph node is noted. This is not pathologically enlarged by measurement criteria. IMPRESSION: No evidence of left lower extremity DVT. Electronically Signed   By: Jolaine Click M.D.   On: 10/09/2016 12:02     ASSESSMENT AND PLAN:   71 year old female with a history of atrial fibrillation, diabetes and hypothyroidism who presented with left lower extremity pain and swelling  1. Left lower extremity cellulitis: Lower external he Dopplers are negative for DVT MRSA PCR was positive Continue vancomycin for at least another 24 hours Elevate legs Follow CBC   2 Chronic Pain - cont. Oxycodone, Fentanyl patch.   3. HTN - cont. Metoprolol, Norvasc Blood pressure acceptable.  4. Atrial fibrillation-rate controlled. Continue metoprolol. Pharmacy consultation for Coumadin dosing.   5. Hypothyroidism-continue Synthroid.  6 Neuropathy-continue gabapentin.  7. Depression-continue Cymbalta.  8. GERD-continue Zantac.    Management plans discussed with the patient and she is in agreement.  CODE STATUS: full  TOTAL TIME TAKING CARE OF THIS PATIENT: 30 minutes.     POSSIBLE D/C tomorrow, DEPENDING ON CLINICAL CONDITION.   Autumn [name] M.D on 10/10/2016 at 6:21 AM  Between 7am to 6pm - Pager - [phone] After 6pm go to [email or web address] - password [email or web address]  [facility] [place] [facility]  Office  [phone]  CC: Primary care physician; Ruel Favors, MD  Note: This dictation was prepared with Dragon dictation along with smaller phrase technology. Any transcriptional errors that result from this process are unintentional.

## 2016-10-10 NOTE — Clinical Social Work Note (Signed)
Clinical Social Work Assessment  Patient Details  Name: Autumn [name] MRN: [id number] Date of Birth: 06-Jul-1946  Date of referral:  10/10/16               Reason for consult:  Other (Comment Required) (From Group Home. )                Permission sought to share information with:  Facility Art therapist granted to share information::  Yes, Verbal Permission Granted  Name::        Agency::     Relationship::     Contact Information:     Housing/Transportation Living arrangements for the past 2 months:  Group Home Source of Information:  Patient Patient Interpreter Needed:  None Criminal Activity/Legal Involvement Pertinent to Current Situation/Hospitalization:  No - Comment as needed Significant Relationships:  Adult Children Lives with:  Facility Resident Do you feel safe going back to the place where you live?  Yes Need for family participation in patient care:  No (Coment)  Care giving concerns:  Patient is a resident at [facility] group home in [place], [place].   Social Worker assessment / plan:  Holiday representative (CSW) received consult that patient is from a group home. Per chart patient is from [facility] in [place]. CSW contacted group and spoke to staff member Estill Bamberg. Per Estill Bamberg patient is a resident and can return when stable. Per Estill Bamberg they can pick patient up tomorrow at 10 am if she is ready for D/C. Per Estill Bamberg if patient is not ready by 10 am the hospital will have to find a ride for patient. CSW met with patient alone at bedside to discuss D/C plan. Patient was alert and oriented X4. Patient reported that she has lived at the group home for 14 months and would like to return there. Patient reported that she is independent with all her ADL's including ambulation. Patient reported that she has 1 adult son that lives in [place], [place] that she does not talk to very often. CSW will continue to follow and assist as  needed.   Employment status:  Retired Nurse, adult PT Recommendations:  Not assessed at this time Information / Referral to community resources:  Other (Comment Required) (Patient will return to group home. )  Patient/Family's Response to care:  Patient prefers to return to group home.   Patient/Family's Understanding of and Emotional Response to Diagnosis, Current Treatment, and Prognosis:  Patient was very pleasant and thanked CSW for visit.   Emotional Assessment Appearance:  Appears stated age Attitude/Demeanor/Rapport:    Affect (typically observed):  Accepting, Adaptable, Pleasant Orientation:  Oriented to Self, Oriented to Place, Oriented to  Time, Oriented to Situation Alcohol / Substance use:  Not Applicable Psych involvement (Current and /or in the community):  No (Comment)  Discharge Needs  Concerns to be addressed:  Discharge Planning Concerns Readmission within the last 30 days:  No Current discharge risk:  None Barriers to Discharge:  Continued Medical Work up   [employer], Veronia Beets, LCSW 10/10/2016, 5:05 PM

## 2016-10-10 NOTE — NC FL2 (Signed)
[place] MEDICAID FL2 LEVEL OF CARE SCREENING TOOL     IDENTIFICATION  Patient Name: Autumn [name] Birthdate: [birth year] Sex: female Admission Date (Current Location): 10/09/2016  [facility] and [place] Number:  Chiropodist and Address:  [facility], [address] [place], [place] [address]      Provider Number: [id number]  Attending Physician Name and Address:  Houston Siren, MD  Relative Name and Phone Number:       Current Level of Care: Hospital Recommended Level of Care: [facility] Prior Approval Number:    Date Approved/Denied:   PASRR Number:  [id number])  Discharge Plan: Other (Comment) [facility]. )    Current Diagnoses: Patient Active Problem List   Diagnosis Date Noted  . Cellulitis 10/09/2016    Orientation RESPIRATION BLADDER Height & Weight     Self, Time, Situation, Place  Normal Continent Weight: 195 lb (88.5 kg) Height:  5\' 6"  (167.6 cm)  BEHAVIORAL SYMPTOMS/MOOD NEUROLOGICAL BOWEL NUTRITION STATUS   (none)  (none) Continent Diet (Diet: Heart Healthy )  AMBULATORY STATUS COMMUNICATION OF NEEDS Skin   Independent Verbally Normal                       Personal Care Assistance Level of Assistance  Bathing, Feeding, Dressing Bathing Assistance: Independent Feeding assistance: Independent Dressing Assistance: Independent     Functional Limitations Info  Sight, Hearing, Speech Sight Info: Adequate Hearing Info: Adequate Speech Info: Adequate    SPECIAL CARE FACTORS FREQUENCY  PT (By licensed PT)     PT Frequency:  (2-3 days per home health )              Contractures      Additional Factors Info  Code Status, Allergies, Isolation Precautions Code Status Info:  (Full Code. ) Allergies Info:  (Ceclor Cefaclor, Iodine, Stelazine Trifluoperazine)     Isolation Precautions Info:  (MRSA Nasal Swab. )     Current Medications (10/10/2016):  This is the current hospital  active medication list Current Facility-Administered Medications  Medication Dose Route Frequency Provider Last Rate Last Dose  . acetaminophen (TYLENOL) tablet 650 mg  650 mg Oral Q6H PRN Houston Siren, MD       Or  . acetaminophen (TYLENOL) suppository 650 mg  650 mg Rectal Q6H PRN Houston Siren, MD      . amLODipine (NORVASC) tablet 10 mg  10 mg Oral Daily Houston Siren, MD   10 mg at 10/10/16 1202  . cholecalciferol (VITAMIN D) tablet 400 Units  400 Units Oral Daily Houston Siren, MD   400 Units at 10/10/16 1201  . DULoxetine (CYMBALTA) DR capsule 60 mg  60 mg Oral Daily Houston Siren, MD   60 mg at 10/10/16 1201  . famotidine (PEPCID) tablet 20 mg  20 mg Oral Daily Houston Siren, MD   20 mg at 10/10/16 1201  . [START ON 10/11/2016] fentaNYL (DURAGESIC - dosed mcg/hr) 12.5 mcg  12.5 mcg Transdermal Q72H Houston Siren, MD      . gabapentin (NEURONTIN) capsule 300 mg  300 mg Oral QHS Houston Siren, MD   300 mg at 10/09/16 2112  . levothyroxine (SYNTHROID, LEVOTHROID) tablet 125 mcg  125 mcg Oral QAC breakfast Houston Siren, MD   125 mcg at 10/10/16 1202  . metoprolol (LOPRESSOR) tablet 100 mg  100 mg Oral BID Houston Siren, MD   100  mg at 10/10/16 1202  . mirtazapine (REMERON [name]-TAB) disintegrating tablet 30 mg  30 mg Oral QHS Houston SirenVivek J Sainani, MD   30 mg at 10/09/16 2112  . ondansetron (ZOFRAN) tablet 4 mg  4 mg Oral Q6H PRN Houston SirenVivek J Sainani, MD       Or  . ondansetron (ZOFRAN) injection 4 mg  4 mg Intravenous Q6H PRN Houston SirenVivek J Sainani, MD      . oxyCODONE (OXYCONTIN) 12 hr tablet 10 mg  10 mg Oral QID Houston SirenVivek J Sainani, MD   10 mg at 10/10/16 1642  . traZODone (DESYREL) tablet 100 mg  100 mg Oral QHS Houston SirenVivek J Sainani, MD   100 mg at 10/09/16 2112  . vancomycin (VANCOCIN) IVPB 1000 mg/200 mL premix  1,000 mg Intravenous Q18H Sheema M Hallaji, RPH   1,000 mg at 10/10/16 0012     Discharge Medications: Please see discharge summary for a list of discharge  medications.  Relevant Imaging Results:  Relevant Lab Results:   Additional Information  (SSN: [id number])  Hayzel Ruberg, Darleen CrockerBailey M, LCSW

## 2016-10-10 NOTE — Progress Notes (Signed)
ANTICOAGULATION CONSULT NOTE  Pharmacy Consult for warfarin dosing  Indication: atrial fibrillation   71 yo female admitted with cellulitis. Patient receives warfarin 6mg  Q24hr for Afib, goal INR 2-3. Patient receiving vancomycin for cellulitis.    Plan:   Patient's INR elevated. Will hold today's dose and obtain follow up INR with am labs. On 1/19.    Allergies  Allergen Reactions  . Ceclor [Cefaclor] Hives  . Iodine Hives    IV contrast dye  . Stelazine [Trifluoperazine] Hives    Patient Measurements: Height: 5\' 6"  (167.6 cm) Weight: 195 lb (88.5 kg) IBW/kg (Calculated) : 59.3  Vital Signs: Temp: 98.4 F (36.9 C) (01/18 0429) Temp Source: Oral (01/18 0429) BP: 114/55 (01/18 0429) Pulse Rate: 88 (01/18 0429)  Labs:  Recent Labs  10/09/16 1040 10/10/16 0350  HGB 13.6 13.2  HCT 40.7 39.2  PLT 195 167  LABPROT 32.5* 34.8*  INR 3.08 3.36  CREATININE 1.06* 0.99    Estimated Creatinine Clearance: 60.1 mL/min (by C-G formula based on SCr of 0.99 mg/dL).   Medical History: Past Medical History:  Diagnosis Date  . Atrial fibrillation (HCC)   . Cataracts, bilateral   . Diabetes mellitus without complication (HCC)   . Macular degeneration   . Neuropathy (HCC)   . Thyroid disease     Pharmacy will continue to monitor and adjust per consult.   Simpson,Michael L 10/10/2016,8:45 AM

## 2016-10-11 DIAGNOSIS — L03116 Cellulitis of left lower limb: Secondary | ICD-10-CM | POA: Diagnosis not present

## 2016-10-11 DIAGNOSIS — M7989 Other specified soft tissue disorders: Secondary | ICD-10-CM | POA: Diagnosis not present

## 2016-10-11 LAB — CBC
HEMATOCRIT: 37.2 % (ref 35.0–47.0)
Hemoglobin: 12.6 g/dL (ref 12.0–16.0)
MCH: 33 pg (ref 26.0–34.0)
MCHC: 33.8 g/dL (ref 32.0–36.0)
MCV: 97.5 fL (ref 80.0–100.0)
Platelets: 184 10*3/uL (ref 150–440)
RBC: 3.81 MIL/uL (ref 3.80–5.20)
RDW: 14.2 % (ref 11.5–14.5)
WBC: 9.9 10*3/uL (ref 3.6–11.0)

## 2016-10-11 LAB — VANCOMYCIN, TROUGH: Vancomycin Tr: 12 ug/mL — ABNORMAL LOW (ref 15–20)

## 2016-10-11 LAB — PROTIME-INR
INR: 2.97
Prothrombin Time: 31.5 seconds — ABNORMAL HIGH (ref 11.4–15.2)

## 2016-10-11 MED ORDER — MAGNESIUM CITRATE PO SOLN
1.0000 | Freq: Every day | ORAL | Status: DC | PRN
  Filled 2016-10-11: qty 296

## 2016-10-11 MED ORDER — MUPIROCIN 2 % EX OINT
1.0000 "application " | TOPICAL_OINTMENT | Freq: Two times a day (BID) | CUTANEOUS | Status: DC
  Administered 2016-10-11 – 2016-10-12 (×2): 1 via NASAL
  Filled 2016-10-11: qty 22

## 2016-10-11 MED ORDER — WARFARIN - PHARMACIST DOSING INPATIENT: Freq: Every day | Status: DC

## 2016-10-11 MED ORDER — WARFARIN SODIUM 4 MG PO TABS
4.0000 mg | ORAL_TABLET | Freq: Once | ORAL | Status: AC
  Administered 2016-10-11: 4 mg via ORAL
  Filled 2016-10-11: qty 1

## 2016-10-11 MED ORDER — DOCUSATE SODIUM 100 MG PO CAPS
100.0000 mg | ORAL_CAPSULE | Freq: Every day | ORAL | Status: DC | PRN
  Administered 2016-10-11: 100 mg via ORAL
  Filled 2016-10-11: qty 1

## 2016-10-11 MED ORDER — CHLORHEXIDINE GLUCONATE CLOTH 2 % EX PADS
6.0000 | MEDICATED_PAD | Freq: Every day | CUTANEOUS | Status: DC
  Administered 2016-10-11 – 2016-10-12 (×2): 6 via TOPICAL

## 2016-10-11 MED ORDER — BISACODYL 5 MG PO TBEC
5.0000 mg | DELAYED_RELEASE_TABLET | Freq: Every day | ORAL | Status: DC | PRN
  Administered 2016-10-12: 5 mg via ORAL
  Filled 2016-10-11: qty 1

## 2016-10-11 NOTE — Progress Notes (Signed)
Pharmacy Antibiotic Note  Autumn [name] is a 71 y.o. female admitted on 10/09/2016 with cellulitis.  Pharmacy has been consulted for vancomycin dosing.  Assessment: Patient started on vanc 1g with 6 hour stacked dosing and then 1g every 18 hours. Goal trough 10 - 15 for cellulitis VT 1/19 @ 1100: 12 -- true trough 11.3  Plan: Trough therapeutic--will continue current dose and will recheck VT on 1/21 @ 1700 prior to 4th dose. Vancomycin 1000 mg IV every 18 hours.  Goal trough 10-15 mcg/mL.  Will continue to monitor renal function.  Height: 5\' 6"  (167.6 cm) Weight: 195 lb (88.5 kg) IBW/kg (Calculated) : 59.3  Temp (24hrs), Avg:98.3 F (36.8 C), Min:98.2 F (36.8 C), Max:98.3 F (36.8 C)   Recent Labs Lab 10/09/16 1040 10/10/16 0350 10/11/16 0347 10/11/16 1053  WBC 13.2* 13.7* 9.9  --   CREATININE 1.06* 0.99  --   --   VANCOTROUGH  --   --   --  12*    Estimated Creatinine Clearance: 60.1 mL/min (by C-G formula based on SCr of 0.99 mg/dL).    Allergies  Allergen Reactions  . Ceclor [Cefaclor] Hives  . Iodine Hives    IV contrast dye  . Stelazine [Trifluoperazine] Hives    Antimicrobials this admission: Vanc 1/17 >>    >>   Dose adjustments this admission:   Microbiology results:  BCx:   UCx:    Sputum:    MRSA PCR: +  Thank you for allowing pharmacy to be a part of this patient's care.  Thomasene Rippleavid Tomesha Sargent, PharmD, BCPS Clinical Pharmacist 10/11/2016

## 2016-10-11 NOTE — Progress Notes (Signed)
ANTICOAGULATION CONSULT NOTE - Follow Up Consult  Pharmacy Consult for warfarin dosing Indication: atrial fibrillation  Allergies  Allergen Reactions  . Ceclor [Cefaclor] Hives  . Iodine Hives    IV contrast dye  . Stelazine [Trifluoperazine] Hives    Patient Measurements: Height: 5\' 6"  (167.6 cm) Weight: 195 lb (88.5 kg) IBW/kg (Calculated) : 59.3 Heparin Dosing Weight:   Vital Signs: Temp: 98.2 F (36.8 C) (01/19 0438) Temp Source: Oral (01/19 0438) BP: 107/55 (01/19 0438) Pulse Rate: 77 (01/19 0438)  Labs:  Recent Labs  10/09/16 1040 10/10/16 0350 10/11/16 0347  HGB 13.6 13.2 12.6  HCT 40.7 39.2 37.2  PLT 195 167 184  LABPROT 32.5* 34.8* 31.5*  INR 3.08 3.36 2.97  CREATININE 1.06* 0.99  --     Estimated Creatinine Clearance: 60.1 mL/min (by C-G formula based on SCr of 0.99 mg/dL).   Medications:  Scheduled:  . amLODipine  10 mg Oral Daily  . cholecalciferol  400 Units Oral Daily  . DULoxetine  60 mg Oral Daily  . famotidine  20 mg Oral Daily  . fentaNYL  12.5 mcg Transdermal Q72H  . gabapentin  300 mg Oral QHS  . levothyroxine  125 mcg Oral QAC breakfast  . metoprolol  100 mg Oral BID  . mirtazapine  30 mg Oral QHS  . oxyCODONE  10 mg Oral QID  . traZODone  100 mg Oral QHS  . vancomycin  1,000 mg Intravenous Q18H  . warfarin  4 mg Oral ONCE-1800    Assessment: INR 1/17 3.08 INR 1/18 3.36 INR 1/19 2.97  Goal of Therapy:  INR 2-3 Monitor platelets by anticoagulation protocol: Yes   Plan:  Patient's INR is currently therapeutic between 2 - 3; however, INR decreased from 3.36 to 2.97, will give a 4 mg dose today 1/19 to prevent overshooting.  Thank you for this consult.  Thomasene Rippleavid Melizza Kanode, PharmD, BCPS Clinical Pharmacist 10/11/2016

## 2016-10-11 NOTE — Progress Notes (Signed)
Per MD patient will not be ready for D/C today and might be ready tomorrow. Clinical Child psychotherapistocial Worker (CSW) contacted [facility] family [facility] and spoke to staff member Marchelle Folksmanda and made her aware of above. Per Marchelle FolksAmanda they may be able to pick up patient from the hospital based on their availability.  Baker Hughes IncorporatedBailey Torence Palmeri, LCSW [phone]

## 2016-10-11 NOTE — Care Management Important Message (Signed)
Important Message  Patient Details  Name: Autumn McgeeMilner Rose [name] MRN: [id number] Date of Birth: 02-17-1946   Medicare Important Message Given:  Yes Copy of signed IM delivered to patient.    Collie SiadAngela Alassane Kalafut, RN 10/11/2016, 8:08 AM

## 2016-10-11 NOTE — Progress Notes (Signed)
[facility] [place] [facility]   PATIENT NAME: Autumn [name]    MR#:  [id number]  DATE OF BIRTH:  1946/01/20  SUBJECTIVE:   Redness and pain of Left leg is improving  REVIEW OF SYSTEMS:    Review of Systems  Constitutional: Negative.  Negative for chills, fever and malaise/fatigue.  HENT: Negative.  Negative for ear discharge, ear pain, hearing loss, nosebleeds and sore throat.   Eyes: Negative.  Negative for blurred vision and pain.  Respiratory: Negative.  Negative for cough, hemoptysis, shortness of breath and wheezing.   Cardiovascular: Negative.  Negative for chest pain, palpitations and leg swelling.  Gastrointestinal: Negative.  Negative for abdominal pain, blood in stool, diarrhea, nausea and vomiting.  Genitourinary: Negative.  Negative for dysuria.  Musculoskeletal: Positive for joint pain. Negative for back pain.  Skin: Negative.        Cellulitis left lower extremity  Neurological: Negative for dizziness, tremors, speech change, focal weakness, seizures and headaches.  Endo/Heme/Allergies: Negative.  Does not bruise/bleed easily.  Psychiatric/Behavioral: Negative.  Negative for depression, hallucinations and suicidal ideas.    DRUG ALLERGIES:   Allergies  Allergen Reactions  . Ceclor [Cefaclor] Hives  . Iodine Hives    IV contrast dye  . Stelazine [Trifluoperazine] Hives    VITALS:  Blood pressure (!) 107/55, pulse 77, temperature 98.2 F (36.8 C), temperature source Oral, resp. rate 16, height 5\' 6"  (1.676 m), weight 88.5 kg (195 lb), SpO2 90 %.  PHYSICAL EXAMINATION:   Physical Exam  Constitutional: She is oriented to person, place, and time and well-developed, well-nourished, and in no distress. No distress.  HENT:  Head: Normocephalic.  Eyes: No scleral icterus.  Neck: Normal range of motion. Neck supple. No JVD present. No tracheal deviation present.  Cardiovascular: Normal rate, regular rhythm and normal heart sounds.  Exam  reveals no gallop and no friction rub.   No murmur heard. Pulmonary/Chest: Effort normal and breath sounds normal. No respiratory distress. She has no wheezes. She has no rales. She exhibits no tenderness.  Abdominal: Soft. Bowel sounds are normal. She exhibits no distension and no mass. There is no tenderness. There is no rebound and no guarding.  Musculoskeletal: Normal range of motion. She exhibits no edema.  Neurological: She is alert and oriented to person, place, and time.  Skin: Skin is warm. No rash noted. No erythema.  Marked erythema and swelling left lower extremity with some mild pain to palpation  Psychiatric: Affect and judgment normal.      LABORATORY PANEL:   CBC  Recent Labs Lab 10/11/16 0347  WBC 9.9  HGB 12.6  HCT 37.2  PLT 184   ------------------------------------------------------------------------------------------------------------------  Chemistries   Recent Labs Lab 10/10/16 0350  NA 141  K 3.7  CL 106  CO2 29  GLUCOSE 123*  BUN 10  CREATININE 0.99  CALCIUM 8.3*   ------------------------------------------------------------------------------------------------------------------  Cardiac Enzymes No results for input(s): TROPONINI in the last 168 hours. ------------------------------------------------------------------------------------------------------------------  RADIOLOGY:  No results found.   ASSESSMENT AND PLAN:   71 year old female with a history of atrial fibrillation, diabetes and hypothyroidism who presented with left lower extremity pain and swelling  1. Left lower extremity cellulitis: Lower external he Dopplers are negative for DVT MRSA PCR was positive Continue vancomycin  Elevate legs Follow CBC Likely discharge tomorrow on Bactrim  2 Chronic Pain - cont. Oxycodone, Fentanyl patch.   3. HTN - cont. Metoprolol, Norvasc  4. Atrial fibrillation-rate controlled. Continue metoprolol. Pharmacy consultation  for  Coumadin dosing.  5. Hypothyroidism-continue Synthroid.  6 Neuropathy-continue gabapentin.  7. Depression-continue Cymbalta.  8. GERD-continue Zantac.  Management plans discussed with the patient and she is in agreement.  CODE STATUS: full  TOTAL TIME TAKING CARE OF THIS PATIENT: 30 minutes.   POSSIBLE D/C tomorrow, DEPENDING ON CLINICAL CONDITION.  Milagros Loll R M.D on 10/11/2016 at 2:15 PM  Between 7am to 6pm - Pager - [phone]  After 6pm go to [email or web address] - password [email or web address]  [facility]  Office  [phone]  CC: Primary care physician; Ruel Favors, MD  Note: This dictation was prepared with Dragon dictation along with smaller phrase technology. Any transcriptional errors that result from this process are unintentional.

## 2016-10-12 DIAGNOSIS — L03116 Cellulitis of left lower limb: Secondary | ICD-10-CM | POA: Diagnosis not present

## 2016-10-12 DIAGNOSIS — M7989 Other specified soft tissue disorders: Secondary | ICD-10-CM | POA: Diagnosis not present

## 2016-10-12 LAB — PROTIME-INR
INR: 2.05
PROTHROMBIN TIME: 23.4 s — AB (ref 11.4–15.2)

## 2016-10-12 MED ORDER — METOPROLOL TARTRATE 100 MG PO TABS: 50.0000 mg | ORAL_TABLET | Freq: Two times a day (BID) | ORAL | 0 refills | Status: AC

## 2016-10-12 MED ORDER — WARFARIN SODIUM 3 MG PO TABS: 3.0000 mg | ORAL_TABLET | Freq: Every day | ORAL | 0 refills | Status: AC

## 2016-10-12 MED ORDER — BISACODYL 5 MG PO TBEC: 5.0000 mg | DELAYED_RELEASE_TABLET | Freq: Every day | ORAL | 0 refills | Status: AC | PRN

## 2016-10-12 MED ORDER — WARFARIN - PHARMACIST DOSING INPATIENT: Freq: Every day | Status: DC

## 2016-10-12 MED ORDER — AMLODIPINE BESYLATE 10 MG PO TABS: 5.0000 mg | ORAL_TABLET | Freq: Every day | ORAL | 0 refills | Status: AC

## 2016-10-12 MED ORDER — SULFAMETHOXAZOLE-TRIMETHOPRIM 800-160 MG PO TABS: 1.0000 | ORAL_TABLET | Freq: Two times a day (BID) | ORAL | 0 refills | Status: AC

## 2016-10-12 MED ORDER — DOCUSATE SODIUM 100 MG PO CAPS: 100.0000 mg | ORAL_CAPSULE | Freq: Every day | ORAL | 0 refills | Status: AC | PRN

## 2016-10-12 NOTE — Progress Notes (Signed)
ANTICOAGULATION CONSULT NOTE - Follow Up Consult  Pharmacy Consult for warfarin dosing Indication: atrial fibrillation  Allergies  Allergen Reactions  . Ceclor [Cefaclor] Hives  . Iodine Hives    IV contrast dye  . Stelazine [Trifluoperazine] Hives    Patient Measurements: Height: 5\' 6"  (167.6 cm) Weight: 195 lb (88.5 kg) IBW/kg (Calculated) : 59.3 Heparin Dosing Weight:   Vital Signs: Temp: 98.8 F (37.1 C) (01/20 0732) Temp Source: Oral (01/20 0732) BP: 108/56 (01/20 0732) Pulse Rate: 86 (01/20 0732)  Labs:  Recent Labs  10/10/16 0350 10/11/16 0347 10/12/16 0312  HGB 13.2 12.6  --   HCT 39.2 37.2  --   PLT 167 184  --   LABPROT 34.8* 31.5* 23.4*  INR 3.36 2.97 2.05  CREATININE 0.99  --   --     Estimated Creatinine Clearance: 60.1 mL/min (by C-G formula based on SCr of 0.99 mg/dL).   Medications:  Scheduled:  . amLODipine  10 mg Oral Daily  . Chlorhexidine Gluconate Cloth  6 each Topical Q0600  . cholecalciferol  400 Units Oral Daily  . DULoxetine  60 mg Oral Daily  . famotidine  20 mg Oral Daily  . fentaNYL  12.5 mcg Transdermal Q72H  . gabapentin  300 mg Oral QHS  . levothyroxine  125 mcg Oral QAC breakfast  . metoprolol  100 mg Oral BID  . mirtazapine  30 mg Oral QHS  . mupirocin ointment  1 application Nasal BID  . oxyCODONE  10 mg Oral QID  . traZODone  100 mg Oral QHS  . vancomycin  1,000 mg Intravenous Q18H  . Warfarin - Pharmacist Dosing Inpatient   Does not apply q1800    Assessment:Patient receives warfarin 6mg  Q24hr for Afib, goal INR 2-3.   INR 1/17 3.08  Warfarin 6 mg INR 1/18 3.36   Dose HELD INR 1/19 2.97  Warfarin 4 mg INR 1/20 2.05  Goal of Therapy:  INR 2-3 Monitor platelets by anticoagulation protocol: Yes   Plan:  Patient's INR is currently therapeutic between 2 - 3; INR= 2.05. Will give Warfarin 4mg  x 1 today and reassess INR in am.   Thank you for this consult.  Bari MantisKristin Demetri Goshert PharmD Clinical  Pharmacist 10/12/2016

## 2016-10-12 NOTE — Discharge Summary (Addendum)
Autumn [name], is a 71 y.o. female  DOB [birth year]  MRN [id number].  Admission date:  10/09/2016  Admitting Physician  Houston SirenVivek J Sainani, MD  Discharge Date:  10/12/2016   Primary MD  Ruel FavorsKrichna F Sowles, MD  Recommendations for primary care physician for things to follow:   Follow-up with primary doctor in 1 week.  Admission Diagnosis  Cellulitis of left lower extremity [L03.116]   Discharge Diagnosis  Cellulitis of left lower extremity [L03.116]   Active Problems:   Cellulitis      Past Medical History:  Diagnosis Date  . Atrial fibrillation (HCC)   . Cataracts, bilateral   . Diabetes mellitus without complication (HCC)   . Macular degeneration   . Neuropathy (HCC)   . Thyroid disease     Past Surgical History:  Procedure Laterality Date  . ABDOMINAL HYSTERECTOMY     partial  . APPENDECTOMY    . CHOLECYSTECTOMY    . HERNIA REPAIR     hiatal  . liver repair     liver laceration  . SPLENECTOMY         History of present illness and  Hospital Course:     Kindly see H&P for history of present illness and admission details, please review complete Labs, Consult reports and Test reports for all details in brief  HPI  from the history and physical done on the day of admission Autumn [name]  is a 71 y.o. female with a known history of atrial fibrillation, diabetes, macular degeneration, neuropathy, hypothyroidism who presents to the hospital due to right lower extremity redness swelling. Patient resides at a assisted living and the nursing staff noticed that her left leg was red and swollen and therefore sent her to the ER for further evaluation. Patient was clinically noted to have a right lower extremity cellulitis , admitted for cellulitis.   Hospital Course  1Left lower extremity cellulitis: Lower  external he Dopplers are negative for DVT MRSA PCR was positive Code IV vancomycin for 4 days, discharging the [name] with Bactrim DS 1 tablet by mouth twice a day for 10 days. Elevate legs WBC normalized.  . Chronic Pain -  Fentanyl patch.   4. HTN - doses  of metoprolol, Norvasc are adjusted  5. Atrial fibrillation-rate controlled. Continue metoprolol. Coumadin dose decreased due to interaction with bactrim.recommend checking INR  Level every 2-3 days while on bactrim and adjust coumadin.  6. Hypothyroidism-continue Synthroid.  7. Neuropathy-continue gabapentin.  8. Depression-continue Cymbalta.  9. GERD-continue Zantac.    Discharge Condition: stable   Follow UP  Follow-up Information    Ruel FavorsKrichna F Sowles, MD Follow up in 1 week(s).   Specialty:  Family Medicine Contact information: [address] [place] [address] [phone]             Discharge Instructions  and  Discharge Medications   Discharge to [facility]    Allergies as of 10/12/2016      Reactions   Ceclor [cefaclor] Hives   Iodine Hives   IV contrast dye   Stelazine [trifluoperazine] Hives      Medication List    STOP taking these medications   oxyCODONE 10 mg 12 hr tablet Commonly known as:  OXYCONTIN   oxyCODONE-acetaminophen 7.5-325 MG tablet Commonly known as:  PERCOCET     TAKE these medications   amLODipine 10 MG tablet Commonly known as:  NORVASC Take 0.5 tablets (5 mg total) by mouth  daily. What changed:  how much to take   bisacodyl 5 MG EC tablet Commonly known as:  DULCOLAX Take 1 tablet (5 mg total) by mouth daily as needed for moderate constipation.   docusate sodium 100 MG capsule Commonly known as:  COLACE Take 1 capsule (100 mg total) by mouth daily as needed for mild constipation.   DULoxetine 60 MG capsule Commonly known as:  CYMBALTA Take 60 mg by mouth daily.   fentaNYL 12 MCG/HR Commonly known as:   DURAGESIC - dosed mcg/hr Place 1 patch onto the skin every 3 (three) days.   gabapentin 300 MG capsule Commonly known as:  NEURONTIN Take 300 mg by mouth at bedtime.   levothyroxine 125 MCG tablet Commonly known as:  SYNTHROID, LEVOTHROID Take 125 mcg by mouth daily.   metoprolol 100 MG tablet Commonly known as:  LOPRESSOR Take 0.5 tablets (50 mg total) by mouth 2 (two) times daily. What changed:  how much to take   mirtazapine 30 MG disintegrating tablet Commonly known as:  REMERON SOL-TAB Take 30 mg by mouth at bedtime.   ranitidine 150 MG tablet Commonly known as:  ZANTAC Take 150 mg by mouth 2 (two) times daily as needed.   sulfamethoxazole-trimethoprim 800-160 MG tablet Commonly known as:  BACTRIM DS,SEPTRA DS Take 1 tablet by mouth 2 (two) times daily.   traZODone 100 MG tablet Commonly known as:  DESYREL Take 1 tablet (100 mg total) by mouth at bedtime.   Vitamin D3 400 units Chew Chew 1 tablet by mouth daily.   warfarin 3 MG tablet Commonly known as:  COUMADIN Take 1 tablet (3 mg total) by mouth daily. What changed:  medication strength  how much to take  when to take this         Diet and Activity recommendation: See Discharge Instructions above    Consults obtained - none   Major procedures and Radiology Reports - PLEASE review detailed and final reports for all details, in brief -     Dg Chest 2 View  Result Date: 09/25/2016 CLINICAL DATA:  Shortness of breath and cough.  Fever for 1 week EXAM: CHEST  2 VIEW COMPARISON:  None. FINDINGS: Generalized interstitial coarsening which is likely bronchitic. There is hyperinflation with diaphragm flattening. Mild streaky atelectatic type opacity at the right base. Borderline heart size and negative mediastinal contours. Remote bilateral rib fractures. Remote L1 compression fracture. IMPRESSION: Bronchitic markings and hyperinflation. Mild basilar atelectasis without focal pneumonia. Electronically  Signed   By: Marnee Spring M.D.   On: 09/25/2016 12:47   US Venous Img Lower Unilateral Left  Result Date: 10/09/2016 CLINICAL DATA:  Left leg swelling for 10 days EXAM: LEFT LOWER EXTREMITY VENOUS DUPLEX ULTRASOUND TECHNIQUE: Doppler venous assessment of the left lower extremity deep venous system was performed, including characterization of spectral flow, compressibility, and phasicity. COMPARISON:  None. FINDINGS: There is complete compressibility of the common femoral, femoral, and popliteal veins. Doppler analysis demonstrates respiratory phasicity and augmentation of flow with calf compression. No obvious superficial vein or calf vein thrombosis. 0.8 cm short axis diameter left inguinal lymph node is noted. This is not pathologically enlarged by measurement criteria. IMPRESSION: No evidence of left lower extremity DVT. Electronically Signed   By: Jolaine Click M.D.   On: 10/09/2016 12:02    Micro Results     Recent Results (from the past 240 hour(s))  MRSA PCR Screening     Status: Abnormal   Collection Time: 10/09/16  9:13  PM  Result Value Ref Range Status   MRSA by PCR POSITIVE (A) NEGATIVE Final    Comment:        The GeneXpert MRSA Assay (FDA approved for NASAL specimens only), is one component of a comprehensive MRSA colonization surveillance program. It is not intended to diagnose MRSA infection nor to guide or monitor treatment for MRSA infections. RESULT CALLED TO, READ BACK BY AND VERIFIED WITH: MATT PAGE AT 2242 ON 10/09/2016 JLJ        [name]   Subjective:   Autumn [name] has no headache,no chest abdominal pain,no new weakness tingling or numbness, feels much better wants to go home [name].  Objective:   Blood pressure (!) 117/58, pulse 82, temperature 98.8 F (37.1 C), temperature source Oral, resp. rate 16, height 5\' 6"  (1.676 m), weight 88.5 kg (195 lb), SpO2 96 %.   Intake/Output Summary (Last 24 hours) at 10/12/16 1114 Last data filed at  10/12/16 0748  Gross per 24 hour  Intake              400 ml  Output                0 ml  Net              400 ml    Exam Awake Alert, Oriented x 3, No new F.N deficits, Normal affect [place].AT,PERRAL Supple Neck,No JVD, No cervical lymphadenopathy appriciated.  Symmetrical Chest wall movement, Good air movement bilaterally, CTAB RRR,No Gallops,Rubs or new Murmurs, No Parasternal Heave +ve B.Sounds, Abd Soft, Non tender, No organomegaly appriciated, No rebound -guarding or rigidity. No Cyanosis, Clubbing or edema,Cellulitis left lower extremity ,redness is improving. Data Review   CBC w Diff:  Lab Results  Component Value Date   WBC 9.9 10/11/2016   HGB 12.6 10/11/2016   HCT 37.2 10/11/2016   PLT 184 10/11/2016   LYMPHOPCT 12 10/09/2016   MONOPCT 10 10/09/2016   EOSPCT 2 10/09/2016   BASOPCT 1 10/09/2016    CMP:  Lab Results  Component Value Date   NA 141 10/10/2016   K 3.7 10/10/2016   CL 106 10/10/2016   CO2 29 10/10/2016   BUN 10 10/10/2016   CREATININE 0.99 10/10/2016  .   Total Time in preparing paper work, data evaluation and todays exam - 35 minutes  Bensyn Bornemann M.D on 10/12/2016 at 11:14 AM    Note: This dictation was prepared with Dragon dictation along with smaller phrase technology. Any transcriptional errors that result from this process are unintentional.

## 2016-10-12 NOTE — Discharge Summary (Signed)
Autumn [name], is a 71 y.o. female  DOB [birth year]  MRN [id number].  Admission date:  10/09/2016  Admitting Physician  Houston SirenVivek J Sainani, MD  Discharge Date:  10/12/2016   Primary MD  Ruel FavorsKrichna F Sowles, MD  Recommendations for primary care physician for things to follow:   Follow-up with primary doctor in 1 week.  Admission Diagnosis  Cellulitis of left lower extremity [L03.116]   Discharge Diagnosis  Cellulitis of left lower extremity [L03.116]   Active Problems:   Cellulitis      Past Medical History:  Diagnosis Date  . Atrial fibrillation (HCC)   . Cataracts, bilateral   . Diabetes mellitus without complication (HCC)   . Macular degeneration   . Neuropathy (HCC)   . Thyroid disease     Past Surgical History:  Procedure Laterality Date  . ABDOMINAL HYSTERECTOMY     partial  . APPENDECTOMY    . CHOLECYSTECTOMY    . HERNIA REPAIR     hiatal  . liver repair     liver laceration  . SPLENECTOMY         History of present illness and  Hospital Course:     Kindly see H&P for history of present illness and admission details, please review complete Labs, Consult reports and Test reports for all details in brief  HPI  from the history and physical done on the day of admission Autumn [name]  is a 71 y.o. female with a known history of atrial fibrillation, diabetes, macular degeneration, neuropathy, hypothyroidism who presents to the hospital due to right lower extremity redness swelling. Patient resides at a assisted living and the nursing staff noticed that her left leg was red and swollen and therefore sent her to the ER for further evaluation. Patient was clinically noted to have a right lower extremity cellulitis , admitted for cellulitis.   Hospital Course  1Left lower extremity cellulitis: Lower  external he Dopplers are negative for DVT MRSA PCR was positive Code IV vancomycin for 4 days, discharging the today with Bactrim DS 1 tablet by mouth twice a day for 10 days. Elevate legs WBC normalized.  . Chronic Pain -  Fentanyl patch.   4. HTN - doses  of metoprolol, Norvasc are adjusted  5. Atrial fibrillation-rate controlled. Continue metoprolol. Coumadin dose decreased due to interaction with bactrim.recommend checking INR  Level every 2-3 days while on bactrim and adjust coumadin.  6. Hypothyroidism-continue Synthroid.  7. Neuropathy-continue gabapentin.  8. Depression-continue Cymbalta.  9. GERD-continue Zantac.    Discharge Condition: stable   Follow UP  Follow-up Information    Ruel FavorsKrichna F Sowles, MD Follow up in 1 week(s).   Specialty:  Family Medicine Contact information: [address] [place] [address] [phone]             Discharge Instructions  and  Discharge Medications   Discharge to [facility] home    Allergies as of 10/12/2016      Reactions   Ceclor [cefaclor] Hives   Iodine Hives   IV contrast dye   Stelazine [trifluoperazine] Hives      Medication List    STOP taking these medications   oxyCODONE-acetaminophen 7.5-325 MG tablet Commonly known as:  PERCOCET     TAKE these medications   amLODipine 10 MG tablet Commonly known as:  NORVASC Take 0.5 tablets (5 mg total) by mouth daily. What changed:  how much to take   bisacodyl 5 MG  EC tablet Commonly known as:  DULCOLAX Take 1 tablet (5 mg total) by mouth daily as needed for moderate constipation.   docusate sodium 100 MG capsule Commonly known as:  COLACE Take 1 capsule (100 mg total) by mouth daily as needed for mild constipation.   DULoxetine 60 MG capsule Commonly known as:  CYMBALTA Take 60 mg by mouth daily.   fentaNYL 12 MCG/HR Commonly known as:  DURAGESIC - dosed mcg/hr Place 1 patch onto the skin every 3  (three) days.   gabapentin 300 MG capsule Commonly known as:  NEURONTIN Take 300 mg by mouth at bedtime.   levothyroxine 125 MCG tablet Commonly known as:  SYNTHROID, LEVOTHROID Take 125 mcg by mouth daily.   metoprolol 100 MG tablet Commonly known as:  LOPRESSOR Take 0.5 tablets (50 mg total) by mouth 2 (two) times daily. What changed:  how much to take   mirtazapine 30 MG disintegrating tablet Commonly known as:  REMERON SOL-TAB Take 30 mg by mouth at bedtime.   oxyCODONE 10 mg 12 hr tablet Commonly known as:  OXYCONTIN Take 10 mg by mouth 4 (four) times daily.   ranitidine 150 MG tablet Commonly known as:  ZANTAC Take 150 mg by mouth 2 (two) times daily as needed.   sulfamethoxazole-trimethoprim 800-160 MG tablet Commonly known as:  BACTRIM DS,SEPTRA DS Take 1 tablet by mouth 2 (two) times daily.   traZODone 100 MG tablet Commonly known as:  DESYREL Take 1 tablet (100 mg total) by mouth at bedtime.   Vitamin D3 400 units Chew Chew 1 tablet by mouth daily.   warfarin 3 MG tablet Commonly known as:  COUMADIN Take 1 tablet (3 mg total) by mouth daily. What changed:  medication strength  how much to take  when to take this         Diet and Activity recommendation: See Discharge Instructions above    Consults obtained - none   Major procedures and Radiology Reports - PLEASE review detailed and final reports for all details, in brief -     Dg Chest 2 View  Result Date: 09/25/2016 CLINICAL DATA:  Shortness of breath and cough.  Fever for 1 week EXAM: CHEST  2 VIEW COMPARISON:  None. FINDINGS: Generalized interstitial coarsening which is likely bronchitic. There is hyperinflation with diaphragm flattening. Mild streaky atelectatic type opacity at the right base. Borderline heart size and negative mediastinal contours. Remote bilateral rib fractures. Remote L1 compression fracture. IMPRESSION: Bronchitic markings and hyperinflation. Mild basilar atelectasis  without focal pneumonia. Electronically Signed   By: Marnee Spring M.D.   On: 09/25/2016 12:47   US Venous Img Lower Unilateral Left  Result Date: 10/09/2016 CLINICAL DATA:  Left leg swelling for 10 days EXAM: LEFT LOWER EXTREMITY VENOUS DUPLEX ULTRASOUND TECHNIQUE: Doppler venous assessment of the left lower extremity deep venous system was performed, including characterization of spectral flow, compressibility, and phasicity. COMPARISON:  None. FINDINGS: There is complete compressibility of the common femoral, femoral, and popliteal veins. Doppler analysis demonstrates respiratory phasicity and augmentation of flow with calf compression. No obvious superficial vein or calf vein thrombosis. 0.8 cm short axis diameter left inguinal lymph node is noted. This is not pathologically enlarged by measurement criteria. IMPRESSION: No evidence of left lower extremity DVT. Electronically Signed   By: Jolaine Click M.D.   On: 10/09/2016 12:02    Micro Results     Recent Results (from the past 240 hour(s))  MRSA PCR Screening  Status: Abnormal   Collection Time: 10/09/16  9:13 PM  Result Value Ref Range Status   MRSA by PCR POSITIVE (A) NEGATIVE Final    Comment:        The GeneXpert MRSA Assay (FDA approved for NASAL specimens only), is one component of a comprehensive MRSA colonization surveillance program. It is not intended to diagnose MRSA infection nor to guide or monitor treatment for MRSA infections. RESULT CALLED TO, READ BACK BY AND VERIFIED WITH: MATT PAGE AT 2242 ON 10/09/2016 JLJ        Today   Subjective:   Autumn Mins today has no headache,no chest abdominal pain,no new weakness tingling or numbness, feels much better wants to go home today.  Objective:   Blood pressure 114/61, pulse 82, temperature 98.8 F (37.1 C), temperature source Oral, resp. rate 16, height 5\' 6"  (1.676 m), weight 88.5 kg (195 lb), SpO2 96 %.   Intake/Output Summary (Last 24 hours) at  10/12/16 1339 Last data filed at 10/12/16 0748  Gross per 24 hour  Intake              400 ml  Output                0 ml  Net              400 ml    Exam Awake Alert, Oriented x 3, No new F.N deficits, Normal affect [place].AT,PERRAL Supple Neck,No JVD, No cervical lymphadenopathy appriciated.  Symmetrical Chest wall movement, Good air movement bilaterally, CTAB RRR,No Gallops,Rubs or new Murmurs, No Parasternal Heave +ve B.Sounds, Abd Soft, Non tender, No organomegaly appriciated, No rebound -guarding or rigidity. No Cyanosis, Clubbing or edema,Cellulitis left lower extremity ,redness is improving. Data Review   CBC w Diff:  Lab Results  Component Value Date   WBC 9.9 10/11/2016   HGB 12.6 10/11/2016   HCT 37.2 10/11/2016   PLT 184 10/11/2016   LYMPHOPCT 12 10/09/2016   MONOPCT 10 10/09/2016   EOSPCT 2 10/09/2016   BASOPCT 1 10/09/2016    CMP:  Lab Results  Component Value Date   NA 141 10/10/2016   K 3.7 10/10/2016   CL 106 10/10/2016   CO2 29 10/10/2016   BUN 10 10/10/2016   CREATININE 0.99 10/10/2016  .   Total Time in preparing paper work, data evaluation and todays exam - 35 minutes  Jerrol Helmers M.D on 10/12/2016 at 1:39 PM    Note: This dictation was prepared with Dragon dictation along with smaller phrase technology. Any transcriptional errors that result from this process are unintentional.

## 2016-10-12 NOTE — Progress Notes (Signed)
[facility] to [facility]. Patient is in no acute distress at this time, and assessment is unchanged from this morning. Patient's IV is out, discharge paperwork has been discussed with patient and placed in packet for facility. There are no questions or concerns at this time. Patient will be accompanied downstairs by staff and facility transport via wheelchair.

## 2016-10-12 NOTE — Progress Notes (Signed)
Pt's DBP is on the low side this AM. Per Dr. Luberta MutterKonidena will give metoprolol now and then wait an hour and give her amlodipine.

## 2016-10-12 NOTE — Clinical Social Work Note (Signed)
Patient to dc to her group home, [facility]. Her transportation is arranged by the group home. Transportation, facility, and patient are aware and in agreement with transport at 1:30 pm. Packet delivered with paperwork for the patient included. CSW will con't to follow pending additional dc needs.  Argentina PonderKaren Martha Jillane Po, MSW, Theresia MajorsLCSWA [phone]

## 2018-09-15 IMAGING — US US EXTREM LOW VENOUS*L*
1 series · 14 of 24 positions shown · non-contrast
Comparison: None.

CLINICAL DATA: Left leg swelling for 10 days

EXAM:
LEFT LOWER EXTREMITY VENOUS DUPLEX ULTRASOUND
TECHNIQUE: Doppler venous assessment of the left lower extremity deep venous
system was performed, including characterization of spectral flow,
compressibility, and phasicity.

[Series 1: us extrem low venous*left* · 0.08mm/px · 14 of 43 slices shown]
[im 1/43]
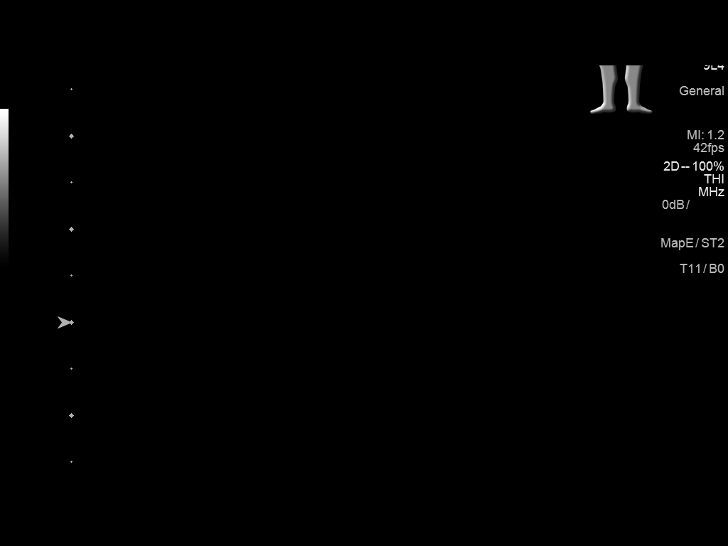
[im 4/43]
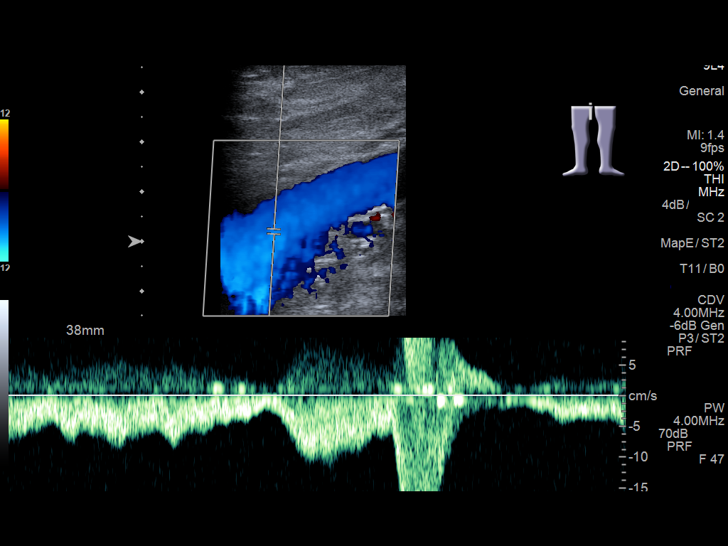
[im 8/43]
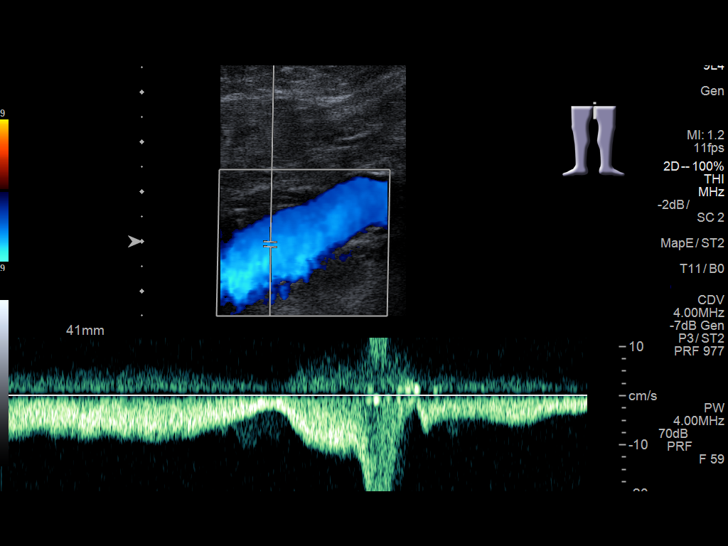
[im 11/43]
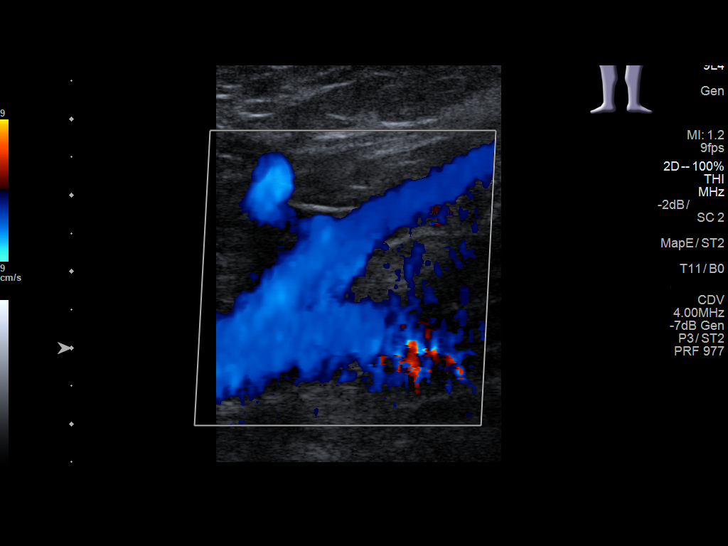
[im 13/43]
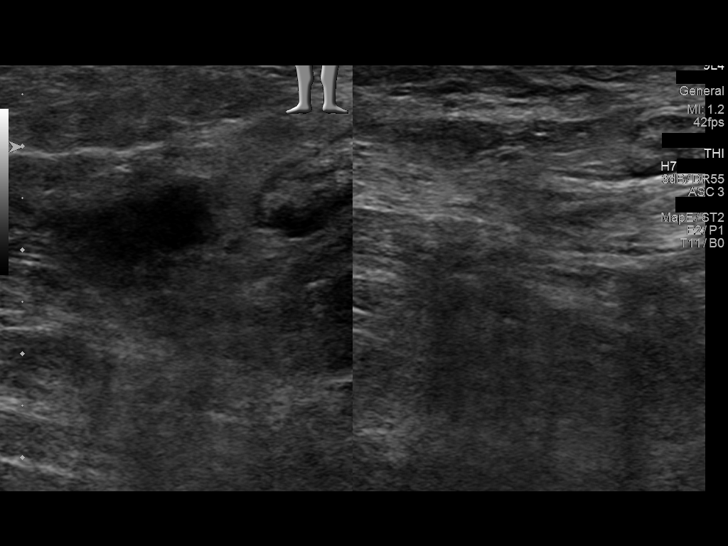
[im 17/43]
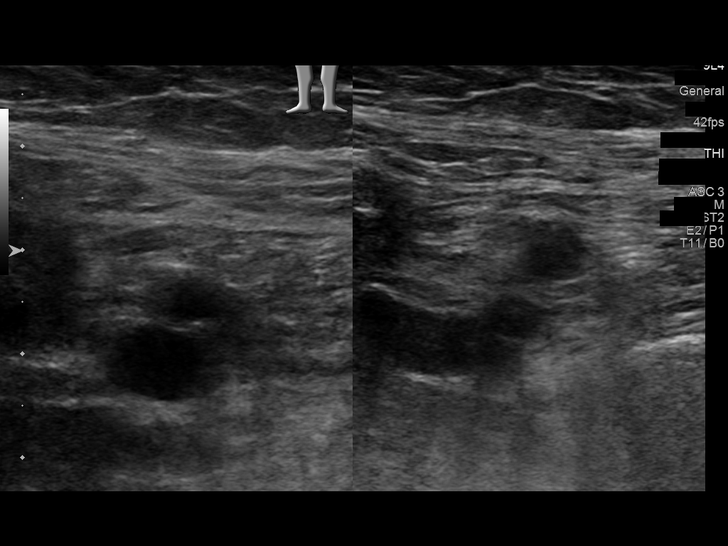
[im 21/43]
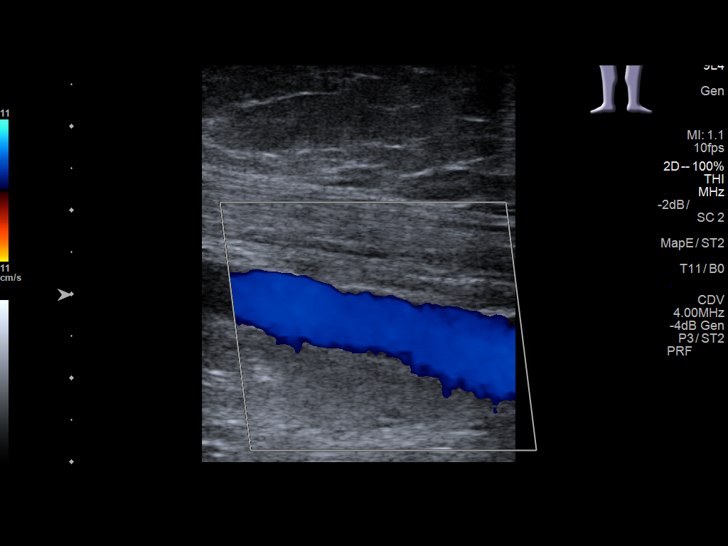
[im 22/43]
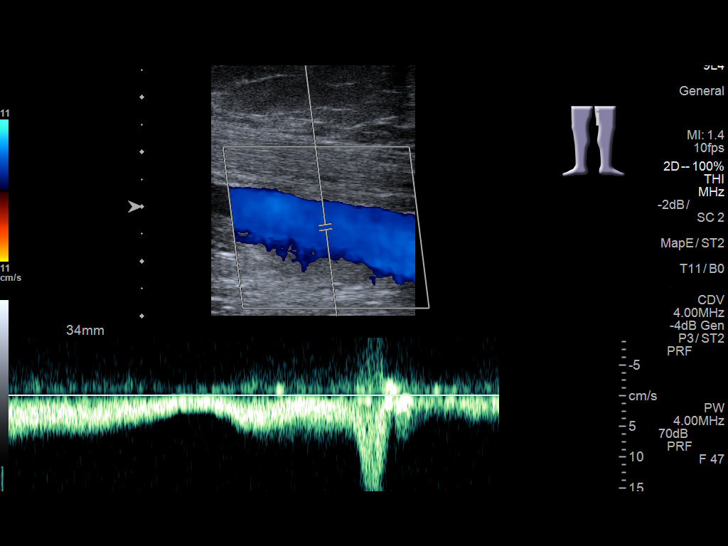
[im 26/43]
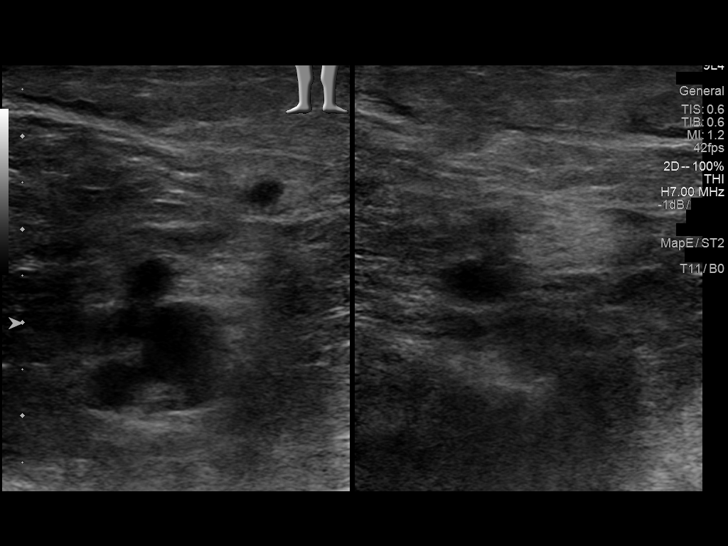
[im 30/43]
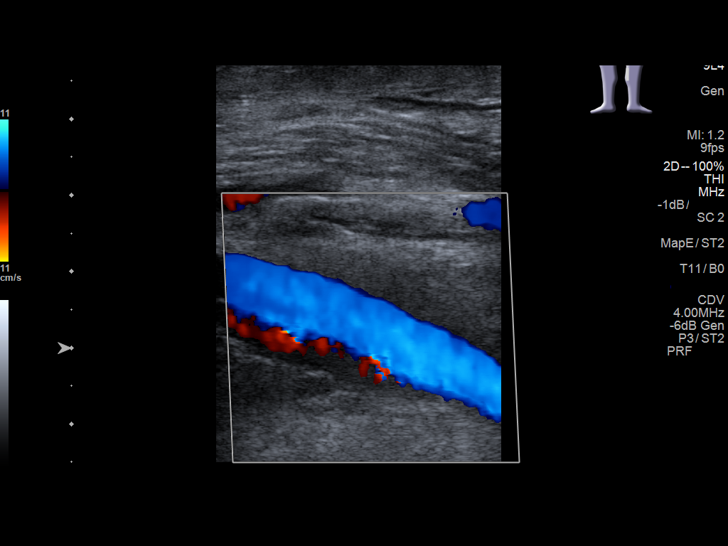
[im 33/43]
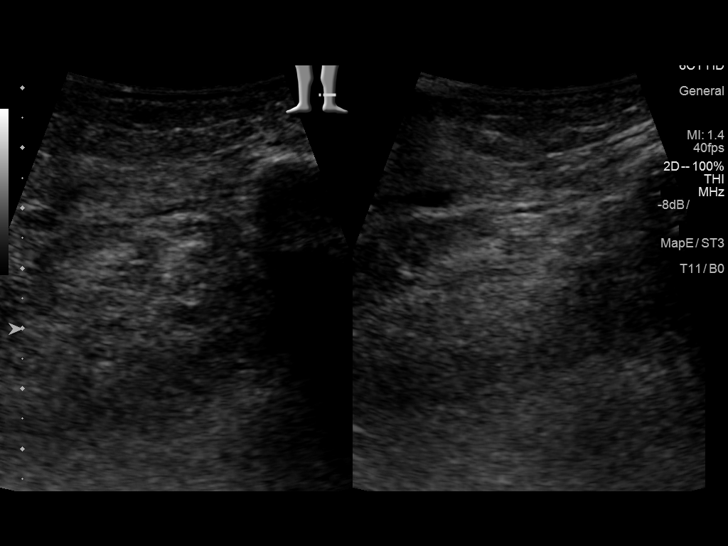
[im 35/43]
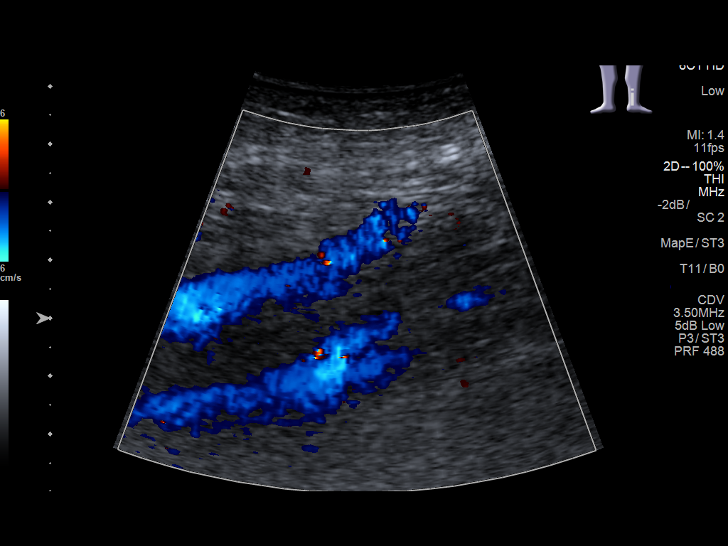
[im 39/43]
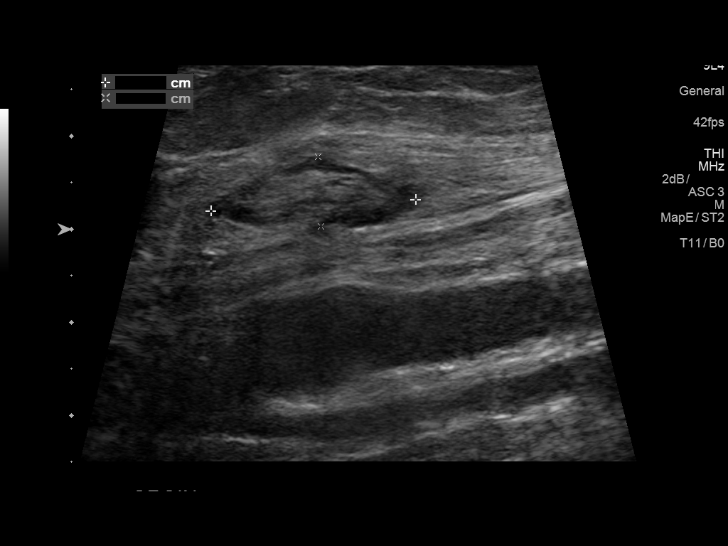
[im 43/43]
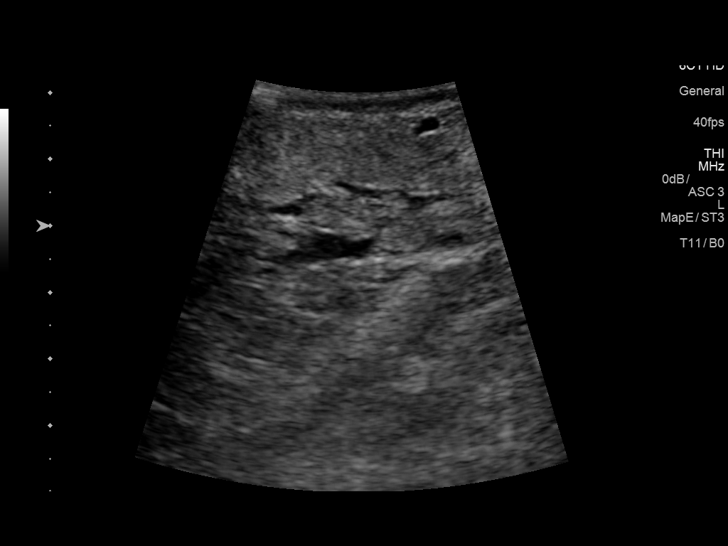

[14 of 24 positions shown; findings below may reference images not displayed]

FINDINGS: There is complete compressibility of the common femoral, femoral,
and popliteal veins. Doppler analysis demonstrates respiratory
phasicity and augmentation of flow with calf compression. No obvious
superficial vein or calf vein thrombosis. 0.8 cm short axis diameter
left inguinal lymph node is noted. This is not pathologically
enlarged by measurement criteria.
IMPRESSION: No evidence of left lower extremity DVT.

## 2021-04-23 DEATH — deceased
